# Patient Record
Sex: Female | Born: 1955 | Race: Black or African American | Hispanic: No | Smoking: Never smoker
Health system: Southern US, Community
[De-identification: ages and names within clinical notes are randomized; demographics above are authoritative.]

## PROBLEM LIST (undated history)

## (undated) DIAGNOSIS — I10 Essential (primary) hypertension: Secondary | ICD-10-CM

## (undated) DIAGNOSIS — G479 Sleep disorder, unspecified: Secondary | ICD-10-CM

## (undated) DIAGNOSIS — R0683 Snoring: Secondary | ICD-10-CM

## (undated) DIAGNOSIS — E2839 Other primary ovarian failure: Secondary | ICD-10-CM

## (undated) DIAGNOSIS — K5900 Constipation, unspecified: Secondary | ICD-10-CM

## (undated) DIAGNOSIS — K5792 Diverticulitis of intestine, part unspecified, without perforation or abscess without bleeding: Secondary | ICD-10-CM

## (undated) DIAGNOSIS — R350 Frequency of micturition: Secondary | ICD-10-CM

## (undated) DIAGNOSIS — G4733 Obstructive sleep apnea (adult) (pediatric): Secondary | ICD-10-CM

## (undated) DIAGNOSIS — E669 Obesity, unspecified: Secondary | ICD-10-CM

## (undated) DIAGNOSIS — D649 Anemia, unspecified: Secondary | ICD-10-CM

## (undated) DIAGNOSIS — M25571 Pain in right ankle and joints of right foot: Secondary | ICD-10-CM

## (undated) DIAGNOSIS — Z973 Presence of spectacles and contact lenses: Secondary | ICD-10-CM

## (undated) DIAGNOSIS — R079 Chest pain, unspecified: Secondary | ICD-10-CM

## (undated) HISTORY — DX: Essential (primary) hypertension: I10

## (undated) HISTORY — DX: Constipation, unspecified: K59.00

## (undated) HISTORY — DX: Diverticulitis of intestine, part unspecified, without perforation or abscess without bleeding: K57.92

## (undated) HISTORY — DX: Chest pain, unspecified: R07.9

## (undated) HISTORY — DX: Other primary ovarian failure: E28.39

## (undated) HISTORY — DX: Sleep disorder, unspecified: G47.9

## (undated) HISTORY — DX: Obstructive sleep apnea (adult) (pediatric): G47.33

## (undated) HISTORY — PX: BREAST SURGERY: SHX581

## (undated) HISTORY — DX: Anemia, unspecified: D64.9

## (undated) HISTORY — DX: Hypercalcemia: E83.52

## (undated) HISTORY — PX: COLONOSCOPY: SHX174

## (undated) HISTORY — DX: Pain in right ankle and joints of right foot: M25.571

## (undated) HISTORY — DX: Frequency of micturition: R35.0

---

## 1998-03-23 ENCOUNTER — Ambulatory Visit (HOSPITAL_COMMUNITY): Admission: RE | Admit: 1998-03-23 | Discharge: 1998-03-23 | Payer: Self-pay | Admitting: Family Medicine

## 2000-02-21 ENCOUNTER — Encounter: Payer: Self-pay | Admitting: Family Medicine

## 2000-02-21 ENCOUNTER — Encounter: Admission: RE | Admit: 2000-02-21 | Discharge: 2000-02-21 | Payer: Self-pay | Admitting: Family Medicine

## 2001-04-20 ENCOUNTER — Encounter: Payer: Self-pay | Admitting: Family Medicine

## 2001-04-20 ENCOUNTER — Encounter: Admission: RE | Admit: 2001-04-20 | Discharge: 2001-04-20 | Payer: Self-pay | Admitting: Family Medicine

## 2001-05-24 ENCOUNTER — Encounter: Admission: RE | Admit: 2001-05-24 | Discharge: 2001-05-24 | Payer: Self-pay | Admitting: Family Medicine

## 2001-05-24 ENCOUNTER — Encounter: Payer: Self-pay | Admitting: Family Medicine

## 2002-01-21 ENCOUNTER — Inpatient Hospital Stay (HOSPITAL_COMMUNITY): Admission: RE | Admit: 2002-01-21 | Discharge: 2002-01-24 | Payer: Self-pay | Admitting: Obstetrics and Gynecology

## 2002-01-21 ENCOUNTER — Encounter (INDEPENDENT_AMBULATORY_CARE_PROVIDER_SITE_OTHER): Payer: Self-pay | Admitting: Specialist

## 2002-01-21 HISTORY — PX: TOTAL ABDOMINAL HYSTERECTOMY: SHX209

## 2002-08-20 ENCOUNTER — Encounter: Payer: Self-pay | Admitting: Family Medicine

## 2002-08-20 ENCOUNTER — Encounter: Admission: RE | Admit: 2002-08-20 | Discharge: 2002-08-20 | Payer: Self-pay | Admitting: Family Medicine

## 2004-11-09 ENCOUNTER — Emergency Department (HOSPITAL_COMMUNITY): Admission: EM | Admit: 2004-11-09 | Discharge: 2004-11-09 | Payer: Self-pay

## 2008-04-09 ENCOUNTER — Encounter: Admission: RE | Admit: 2008-04-09 | Discharge: 2008-04-09 | Payer: Self-pay | Admitting: Family Medicine

## 2009-05-15 ENCOUNTER — Encounter: Admission: RE | Admit: 2009-05-15 | Discharge: 2009-05-15 | Payer: Self-pay | Admitting: Family Medicine

## 2009-05-19 ENCOUNTER — Encounter: Admission: RE | Admit: 2009-05-19 | Discharge: 2009-05-19 | Payer: Self-pay | Admitting: Family Medicine

## 2010-11-01 ENCOUNTER — Encounter: Payer: Self-pay | Admitting: Family Medicine

## 2011-02-25 NOTE — Discharge Summary (Signed)
[facility]  Patient:    Veronica Schmidt, Veronica Schmidt Visit Number: [id number] MRN: [id number]          Service Type: GYN Location: 9300 [id number] Attending Physician:  Wandalee Ferdinand Dictated by:   Rudy Jew Ashley Royalty, M.D. Admit Date:  01/21/2002 Discharge Date: 01/24/2002                             Discharge Summary  DISCHARGE DIAGNOSES:          1. Fibroid uterus.                               2. Abnormal uterine bleeding refractory to                                  medical management.  PROCEDURES:                   Total abdominal hysterectomy.  CONSULTATIONS:                None.  DISCHARGE MEDICATIONS:        Percocet and iron.  HISTORY OF PRESENT ILLNESS:   This is a 55 year old gravida 2, para 2, referred through the courtesy of Lupe Carney, M.D. for evaluation of fibroid uterus with abnormal uterine bleeding.  The patient was also noted to have a chronic iron-deficiency anemia.  She had been treated with Megace and iron therapy in order improve her anemia and optimize her candidacy for surgery. For the remainder of the History & Physical, please see chart.  HOSPITAL COURSE:              The patient was admitted to [facility] [place].  Initial laboratory studies were drawn.  On January 21, 2002, she was taken to the operating room and underwent total abdominal hysterectomy. The procedure was uncomplicated.  The patient was noted to have a hemoglobin of 7.9 on April 17 which was consistent with blood loss in addition to her known chronic iron-deficiency anemia.  She was felt to be stable for discharge on January 24, 2002, and was discharged home on the aforementioned medications. Pathology report returned showing fibroid uterus only.  DIAGNOSTIC DATA:              Hemoglobin and hematocrit on admission were 10.1 and 31.3, respectively.  Repeat values obtained on January 24, 2002, were 9.0 and 27.7, respectively.  White blood cell  count at discharge was 6200.   Type and Rh revealed O positive blood.  DISPOSITION:                  The patient will return to [facility] in four to six weeks for postoperative evaluation. Dictated by:   Rudy Jew Ashley Royalty, M.D. Attending Physician:  Wandalee Ferdinand DD:  02/19/02 TD:  02/20/02 Job: [id number]

## 2011-02-25 NOTE — Op Note (Signed)
[facility]  Patient:    Veronica Schmidt, Veronica Schmidt Visit Number: [id number] MRN: [id number]          Service Type: GYN Location: 9300 [id number] Attending Physician:  Wandalee Ferdinand Dictated by:   Rudy Jew Ashley Royalty, M.D. Proc. Date: 01/21/02 Admit Date:  01/21/2002                             Operative Report  PREOPERATIVE DIAGNOSES:       1. Fibroid uterus.                               2. Abnormal uterine bleeding -- probably                                  secondary to #1; refractory to medical                                  management.  POSTOPERATIVE DIAGNOSES:      1. Fibroid uterus.                               2. Abnormal uterine bleeding -- probably                                  secondary to #1; refractory to medical                                  management.  Pathology is pending.  PROCEDURE:                    Total abdominal hysterectomy.  SURGEON:                      Rudy Jew. Ashley Royalty, M.D.  ASSISTANT:                    Everlena Cooper, M.D.  ANESTHESIA:                   General.  ESTIMATED BLOOD LOSS:         100 cc.  COMPLICATIONS:                None.  PACKS/DRAINS:                 Foley catheter.  COUNTS:                       Sponge, needle and instrument count were reported as correct x2.  DESCRIPTION OF PROCEDURE:     The patient was taken to the operating room and placed in the dorsal supine position.  After patient was administered adequate general anesthesia, she was placed in a frog-leg position and prepped and draped in the usual sterile fashion for abdominal and vaginal surgery.  A Pfannenstiel incision was made down to the level of the fascia.  The fascia was nicked with a knife and incised transversely with Mayo scissors.  The underlying rectus muscles were separated from the overlying  fascia using sharp and blunt dissection.  The rectus muscles were separated in the midline, exposing the peritoneum and  entering it atraumatically with Metzenbaum scissors.  The incision was extended longitudinally.  The uterus was identified and noted to have approximately 16-week size, multinodular.  The upper abdomen was explored.  The kidneys were present bilaterally.  The liver surface was smooth.  The peritoneal surfaces were smooth and glistening.  I can appreciate no periaortic adenopathy.  Next, Balfour retractor was placed.  The upper abdomen was packed off.  The fallopian tubes and ovaries were noted bilaterally and felt to be normal.  The round ligaments were then clamped, cut and secured with #1 chromic catgut.  A bladder flap was developed anteriorly using sharp and blunt dissection.  Next, the fallopian tubes, utero-ovarian ligaments were clamped, cut and doubly secured with #1 chromic catgut -- preserving the adnexa bilaterally.   Next, the uterine vessels were skeletonized.  The uterine vessels were then clamped, cut and doubly secured with #1 chromic catgut.  At this point the corpus was excised with the knife and submitted separately to pathology for histologic studies.  Next, straight Heaney valentine clamps were used to come down on either side of the cervix in order clamp, cut and secure the cardinal ligaments. The uterosacral ligaments were clamped, cut and secured with #1 chromic catgut; held separately to be tied later.  Next, the vagina was entered anteriorly.  The cervix was excised with Satinsky scissors, and submitted to pathology for histologic studies.  Richardson angled sutures were placed with 0 chromic catgut bilaterally at the uterine and vaginal angles.  Next, the vaginal cuff was closed circumferentially with a running, locking suture of 2-0 chromic.  As the diameter of the vagina was moderately large, at this point a separate figure-of-eight suture was placed in the midline to avoid any hernia formation.  Hemostasis was noted.  All pedicles were inspected and  found to be dry.  Copious irrigation was accomplished.  The ureters were identified bilaterally and felt to be peristalsing normally.  At this point the urine was clear and copious.  The patient was felt to have benefited maximally from the surgical procedure at this point.  All packs were removed.  The patient was once again noted to be hemostatic. The fascia was then closed with 0 Vicryl in a running fashion.  The skin was closed with staples.  The patient tolerated the procedure extremely well and was returned to the recovery room in good condition. Dictated by:   Rudy Jew Ashley Royalty, M.D. Attending Physician:  Wandalee Ferdinand DD:  01/21/02 TD:  01/21/02 Job: [id number]

## 2011-02-25 NOTE — H&P (Signed)
[facility]  Patient:    Veronica Schmidt, Veronica Schmidt Visit Number: [id number] MRN: [id number]          Service Type: GYN Location: 9300 [id number] 01 Attending Physician:  Wandalee Ferdinand Dictated by:   Rudy Jew Ashley Royalty, M.D. Admit Date:  01/21/2002                           History and Physical  HISTORY OF PRESENT ILLNESS:   A 55 year old gravida 2, para 2, referred through the courtesy of Lupe Carney, M.D. for evaluation of a fibroid uterus with abnormal uterine bleeding.  The patient also has a known chronic anemia. At the time of patients presentation in July 2002, her periods were irregular, lasting seven days, and quite excessive in amount.  An ultrasound was performed at [facility] on April 20, 2001, and revealed a large fibroid uterus.  At that time, the hemoglobin was approximately 7.0. The patient had a subsequent endometrial biopsy which was benign.  She was treated with Megace and iron therapy.  Ultimately, she returned in January stating she continued to have abnormal uterine bleeding, and seeks definitive therapy in the form of hysterectomy.  MEDICATIONS:                  Atenolol, hydrochlorothiazide, iron.  PAST MEDICAL HISTORY:         Hypertension.  PAST SURGICAL HISTORY:        Negative.  ALLERGIES:                    No known drug allergies.  FAMILY HISTORY:               Noncontributory.  SOCIAL HISTORY:               The patient denies use of tobacco or significant alcohol.  REVIEW OF SYSTEMS:            Noncontributory.  PHYSICAL EXAMINATION:  GENERAL:                      A well-developed and well-nourished pleasant white female in no acute distress.  VITAL SIGNS:                  Afebrile and vital signs stable.  SKIN:                         Warm and dry without lesions.  LYMPH:                        There is no supraclavicular, cervical, or inguinal adenopathy.  HEENT:                         Normocephalic.  NECK:                         Supple without thyromegaly.  CHEST:                        Lungs are clear.  CARDIAC:                      Regular rate and rhythm without murmurs, gallops, or rubs.  BREASTS:  Exam deferred.  ABDOMEN:                      Soft and nontender without masses or organomegaly.  Bowel sounds are active.  MUSCULOSKELETAL:              Examination revealed a full range of motion without edema, cyanosis, or significant tenderness.  PELVIC:                       Examination is deferred until examination under anesthesia.  IMPRESSION:                   1. Fibroid uterus - large.                               2. Abnormal uterine bleeding - refractory to                                  medical management.  PLAN:                         Total abdominal hysterectomy.  Risks, benefits, complications, and alternatives were fully discussed with the patient.  She states she understands and accepts.  Questions invited and answered. Dictated by:   Rudy Jew Ashley Royalty, M.D. Attending Physician:  Wandalee Ferdinand DD:  01/21/02 TD:  01/21/02 Job: [id number]

## 2013-04-24 ENCOUNTER — Ambulatory Visit (INDEPENDENT_AMBULATORY_CARE_PROVIDER_SITE_OTHER): Payer: BC Managed Care – PPO | Admitting: General Surgery

## 2013-04-24 ENCOUNTER — Encounter (INDEPENDENT_AMBULATORY_CARE_PROVIDER_SITE_OTHER): Payer: Self-pay | Admitting: General Surgery

## 2013-04-24 VITALS — BP 158/92 | HR 76 | Resp 14 | Ht 64.0 in | Wt 244.2 lb

## 2013-04-24 DIAGNOSIS — L723 Sebaceous cyst: Secondary | ICD-10-CM

## 2013-04-24 DIAGNOSIS — L720 Epidermal cyst: Secondary | ICD-10-CM | POA: Insufficient documentation

## 2013-04-24 NOTE — Progress Notes (Signed)
Patient ID: Veronica Schmidt, female   DOB: 11/09/1955, 57 y.o.   MRN: [id number]  Chief Complaint  Patient presents with  . New Evaluation    sev cyst    HPI Veronica Schmidt is a 57 y.o. female.   HPI 57-year-old African American female referred by Dr. Mitchell for evaluation of the upper back sebaceous cyst. The patient states that it's been there probably for a few years. She states that it has slowly gotten larger over the past year or 2. It recently started draining some fluid a few weeks ago. She describes the fluid as foul smelling. She denies any fevers or chills. She denies any tenderness over the area. She denies any trauma to the area. She denies any weight change. She denies any night sweats. She denies any other similar lesions. She teaches high school biology  Past Medical History  Diagnosis Date  . Anemia   . Hypertension     Past Surgical History  Procedure Laterality Date  . Abdominal hysterectomy      History reviewed. No pertinent family history.  Social History History  Substance Use Topics  . Smoking status: Never Smoker   . Smokeless tobacco: Never Used  . Alcohol Use: No    No Known Allergies  Current Outpatient Prescriptions  Medication Sig Dispense Refill  . atenolol (TENORMIN) 25 MG tablet Take 25 mg by mouth daily.      . losartan-hydrochlorothiazide (HYZAAR) 100-25 MG per tablet       . Multiple Vitamin (MULTI-VITAMIN DAILY PO) Take by mouth.      . Noni, Morinda citrifolia, 250 MG CAPS Take by mouth.       No current facility-administered medications for this visit.    Review of Systems Review of Systems  Constitutional: Negative for fever, chills and unexpected weight change.  HENT: Negative for hearing loss, congestion, sore throat, trouble swallowing and voice change.   Eyes: Negative for visual disturbance.  Respiratory: Negative for cough, shortness of breath and wheezing.   Cardiovascular: Negative for chest pain, palpitations and  leg swelling.  Gastrointestinal: Negative for nausea, vomiting, abdominal pain, diarrhea, constipation and blood in stool.  Genitourinary: Negative for hematuria, vaginal bleeding and difficulty urinating.  Musculoskeletal: Negative for arthralgias.  Skin: Negative for rash and wound.  Neurological: Negative for seizures, syncope and headaches.  Hematological: Negative for adenopathy. Does not bruise/bleed easily.  Psychiatric/Behavioral: Negative for confusion.    Blood pressure 158/92, pulse 76, resp. rate 14, height 5' 4" (1.626 m), weight 244 lb 3.2 oz (110.768 kg).  Physical Exam Physical Exam  Vitals reviewed. Constitutional: She is oriented to person, place, and time. She appears well-developed and well-nourished. No distress.  obese  HENT:  Head: Normocephalic and atraumatic.  Right Ear: External ear normal.  Left Ear: External ear normal.  Eyes: Conjunctivae are normal. No scleral icterus.  Neck: Normal range of motion. Neck supple. No tracheal deviation present. No thyromegaly present.  Cardiovascular: Normal rate and normal heart sounds.   Pulmonary/Chest: Effort normal and breath sounds normal. No stridor. No respiratory distress. She has no wheezes.    Well circumscribed, mobile, soft, soft tissue mass left upper back, 4 x 4cm with central pore with sebum. No erythema, induration, fluctuance. Non tender  Abdominal: She exhibits no distension.  Musculoskeletal: She exhibits no edema and no tenderness.  Lymphadenopathy:    She has no cervical adenopathy.  Neurological: She is alert and oriented to person, place, and time. She exhibits normal   muscle tone.  Skin: Skin is warm and dry. No rash noted. She is not diaphoretic. No erythema.  Psychiatric: She has a normal mood and affect. Her behavior is normal. Judgment and thought content normal.    Data Reviewed Dr Mitchell's note from 06/2012 Labs from 3/14 - nml cbc, nml cmet  Assessment    Upper back epidermal  inclusion cyst (sebaceous cyst)     Plan    We discussed the etiology and management of sebaceous cysts. The patient was given educational material.   We discussed observation versus surgical excision. We discussed the risks and benefits of surgery including but not limited to bleeding, infection, injury to surrounding structures, scarring, cosmetic concerns, blood clot formation, anesthesia issues, possible recurrence, and the typical postoperative course.   The patient has elected to proceed with EXCISION OF UPPER BACK SEBACEOUS CYST  Harrington Jobe M. Dardan Shelton, MD, FACS General, Bariatric, & Minimally Invasive Surgery [facility]        Annabeth Tortora M 04/24/2013, 11:12 AM    

## 2013-04-24 NOTE — Patient Instructions (Signed)

## 2013-04-25 ENCOUNTER — Encounter (INDEPENDENT_AMBULATORY_CARE_PROVIDER_SITE_OTHER): Payer: Self-pay

## 2013-05-02 ENCOUNTER — Encounter (HOSPITAL_BASED_OUTPATIENT_CLINIC_OR_DEPARTMENT_OTHER): Payer: Self-pay | Admitting: *Deleted

## 2013-05-08 ENCOUNTER — Encounter (HOSPITAL_BASED_OUTPATIENT_CLINIC_OR_DEPARTMENT_OTHER)
Admission: RE | Admit: 2013-05-08 | Discharge: 2013-05-08 | Disposition: A | Discharge status: Home / Self Care | Payer: BC Managed Care – PPO | Source: Ambulatory Visit | Attending: General Surgery | Admitting: General Surgery

## 2013-05-08 ENCOUNTER — Encounter (HOSPITAL_BASED_OUTPATIENT_CLINIC_OR_DEPARTMENT_OTHER): Payer: Self-pay | Admitting: *Deleted

## 2013-05-08 LAB — BASIC METABOLIC PANEL
Calcium: 10.4 mg/dL (ref 8.4–10.5)
GFR calc non Af Amer: 67 mL/min — ABNORMAL LOW (ref >90)
Glucose, Bld: 84 mg/dL (ref 70–99)
Potassium: 4.4 mEq/L (ref 3.5–5.1)
Sodium: 134 mEq/L — ABNORMAL LOW (ref 135–145)

## 2013-05-08 NOTE — Progress Notes (Signed)
To come in for [id number]  

## 2013-05-08 NOTE — Progress Notes (Signed)
05/08/13 1021  OBSTRUCTIVE SLEEP APNEA  Have you ever been diagnosed with sleep apnea through a sleep study? No  Do you snore loudly (loud enough to be heard through closed doors)?  1  Do you often feel tired, fatigued, or sleepy during the daytime? 0  Has anyone observed you stop breathing during your sleep? 0  Do you have, or are you being treated for high blood pressure? 1  BMI more than 35 kg/m2? 1  Age over 56 years old? 1  Gender: 0  Obstructive Sleep Apnea Score 4  Score 4 or greater  Results sent to PCP

## 2013-05-09 ENCOUNTER — Encounter (HOSPITAL_BASED_OUTPATIENT_CLINIC_OR_DEPARTMENT_OTHER): Payer: Self-pay | Admitting: Anesthesiology

## 2013-05-09 ENCOUNTER — Ambulatory Visit (HOSPITAL_BASED_OUTPATIENT_CLINIC_OR_DEPARTMENT_OTHER): Payer: BC Managed Care – PPO | Admitting: Anesthesiology

## 2013-05-09 ENCOUNTER — Encounter (HOSPITAL_BASED_OUTPATIENT_CLINIC_OR_DEPARTMENT_OTHER): Payer: Self-pay | Admitting: *Deleted

## 2013-05-09 ENCOUNTER — Encounter (HOSPITAL_BASED_OUTPATIENT_CLINIC_OR_DEPARTMENT_OTHER)
Admission: RE | Disposition: A | Discharge status: Home / Self Care | Payer: Self-pay | Source: Ambulatory Visit | Attending: General Surgery

## 2013-05-09 ENCOUNTER — Ambulatory Visit (HOSPITAL_BASED_OUTPATIENT_CLINIC_OR_DEPARTMENT_OTHER)
Admission: RE | Admit: 2013-05-09 | Discharge: 2013-05-09 | Disposition: A | Discharge status: Home / Self Care | Payer: BC Managed Care – PPO | Source: Ambulatory Visit | Attending: General Surgery | Admitting: General Surgery

## 2013-05-09 DIAGNOSIS — L723 Sebaceous cyst: Secondary | ICD-10-CM

## 2013-05-09 DIAGNOSIS — Z6841 Body Mass Index (BMI) 40.0 and over, adult: Secondary | ICD-10-CM | POA: Insufficient documentation

## 2013-05-09 DIAGNOSIS — Z79899 Other long term (current) drug therapy: Secondary | ICD-10-CM | POA: Insufficient documentation

## 2013-05-09 DIAGNOSIS — D649 Anemia, unspecified: Secondary | ICD-10-CM | POA: Insufficient documentation

## 2013-05-09 DIAGNOSIS — I10 Essential (primary) hypertension: Secondary | ICD-10-CM | POA: Insufficient documentation

## 2013-05-09 HISTORY — PX: CYST REMOVAL TRUNK: SHX6283

## 2013-05-09 HISTORY — DX: Presence of spectacles and contact lenses: Z97.3

## 2013-05-09 HISTORY — DX: Snoring: R06.83

## 2013-05-09 LAB — POCT HEMOGLOBIN-HEMACUE: Hemoglobin: 13.2 g/dL (ref 12.0–15.0)

## 2013-05-09 SURGERY — CYST REMOVAL TRUNK
Anesthesia: Monitor Anesthesia Care | Site: Back | Laterality: Left | Wound class: Contaminated

## 2013-05-09 MED ORDER — OXYCODONE HCL 5 MG PO TABS: 5.0000 mg | ORAL_TABLET | Freq: Once | ORAL | Status: DC | PRN

## 2013-05-09 MED ORDER — FENTANYL CITRATE 0.05 MG/ML IJ SOLN: 25.0000 ug | INTRAMUSCULAR | Status: DC | PRN

## 2013-05-09 MED ORDER — BUPIVACAINE-EPINEPHRINE 0.25% -1:200000 IJ SOLN
INTRAMUSCULAR | Status: DC | PRN
  Administered 2013-05-09: 25 mL

## 2013-05-09 MED ORDER — OXYCODONE HCL 5 MG/5ML PO SOLN: 5.0000 mg | Freq: Once | ORAL | Status: DC | PRN

## 2013-05-09 MED ORDER — METOCLOPRAMIDE HCL 5 MG/ML IJ SOLN: 10.0000 mg | Freq: Once | INTRAMUSCULAR | Status: DC | PRN

## 2013-05-09 MED ORDER — FENTANYL CITRATE 0.05 MG/ML IJ SOLN: 50.0000 ug | INTRAMUSCULAR | Status: DC | PRN

## 2013-05-09 MED ORDER — PROPOFOL 10 MG/ML IV EMUL
INTRAVENOUS | Status: DC | PRN
  Administered 2013-05-09: 50 ug/kg/min via INTRAVENOUS

## 2013-05-09 MED ORDER — ONDANSETRON HCL 4 MG/2ML IJ SOLN
INTRAMUSCULAR | Status: DC | PRN
  Administered 2013-05-09: 4 mg via INTRAVENOUS

## 2013-05-09 MED ORDER — CEFAZOLIN SODIUM-DEXTROSE 2-3 GM-% IV SOLR
2.0000 g | INTRAVENOUS | Status: AC
  Administered 2013-05-09: 2 g via INTRAVENOUS

## 2013-05-09 MED ORDER — METOPROLOL TARTRATE 25 MG PO TABS
25.0000 mg | ORAL_TABLET | Freq: Once | ORAL | Status: AC
  Administered 2013-05-09: 25 mg via ORAL

## 2013-05-09 MED ORDER — FENTANYL CITRATE 0.05 MG/ML IJ SOLN
INTRAMUSCULAR | Status: DC | PRN
  Administered 2013-05-09: 25 ug via INTRAVENOUS
  Administered 2013-05-09: 75 ug via INTRAVENOUS

## 2013-05-09 MED ORDER — MIDAZOLAM HCL 5 MG/5ML IJ SOLN
INTRAMUSCULAR | Status: DC | PRN
  Administered 2013-05-09: 2 mg via INTRAVENOUS

## 2013-05-09 MED ORDER — MIDAZOLAM HCL 2 MG/2ML IJ SOLN: 1.0000 mg | INTRAMUSCULAR | Status: DC | PRN

## 2013-05-09 MED ORDER — DEXAMETHASONE SODIUM PHOSPHATE 4 MG/ML IJ SOLN
INTRAMUSCULAR | Status: DC | PRN
  Administered 2013-05-09: 8 mg via INTRAVENOUS

## 2013-05-09 MED ORDER — SODIUM BICARBONATE 4 % IV SOLN
INTRAVENOUS | Status: DC | PRN
  Administered 2013-05-09: 5 mL via INTRAVENOUS

## 2013-05-09 MED ORDER — OXYCODONE-ACETAMINOPHEN 5-325 MG PO TABS: 1.0000 | ORAL_TABLET | ORAL | Status: DC | PRN

## 2013-05-09 MED ORDER — LACTATED RINGERS IV SOLN
INTRAVENOUS | Status: DC
  Administered 2013-05-09: 11:00:00 via INTRAVENOUS

## 2013-05-09 SURGICAL SUPPLY — 53 items
BENZOIN TINCTURE PRP APPL 2/3 (GAUZE/BANDAGES/DRESSINGS) ×2 IMPLANT
BLADE HEX COATED 2.75 (ELECTRODE) IMPLANT
BLADE SURG 15 STRL LF DISP TIS (BLADE) ×1 IMPLANT
BLADE SURG 15 STRL SS (BLADE) ×1
BLADE SURG ROTATE 9660 (MISCELLANEOUS) IMPLANT
CANISTER SUCTION 1200CC (MISCELLANEOUS) IMPLANT
CHLORAPREP W/TINT 26ML (MISCELLANEOUS) ×2 IMPLANT
COVER MAYO STAND STRL (DRAPES) ×2 IMPLANT
COVER TABLE BACK 60X90 (DRAPES) ×2 IMPLANT
DECANTER SPIKE VIAL GLASS SM (MISCELLANEOUS) IMPLANT
DERMABOND ADVANCED (GAUZE/BANDAGES/DRESSINGS)
DERMABOND ADVANCED .7 DNX12 (GAUZE/BANDAGES/DRESSINGS) IMPLANT
DRAPE PED LAPAROTOMY (DRAPES) ×2 IMPLANT
DRAPE UTILITY XL STRL (DRAPES) ×2 IMPLANT
DRSG TEGADERM 4X4.75 (GAUZE/BANDAGES/DRESSINGS) ×2 IMPLANT
ELECT COATED BLADE 2.86 ST (ELECTRODE) ×2 IMPLANT
ELECT REM PT RETURN 9FT ADLT (ELECTROSURGICAL) ×2
ELECTRODE REM PT RTRN 9FT ADLT (ELECTROSURGICAL) ×1 IMPLANT
GAUZE PACKING IODOFORM 1/4X5 (PACKING) IMPLANT
GAUZE SPONGE 4X4 12PLY STRL LF (GAUZE/BANDAGES/DRESSINGS) IMPLANT
GLOVE BIO SURGEON STRL SZ7.5 (GLOVE) ×2 IMPLANT
GLOVE BIOGEL M 7.0 STRL (GLOVE) ×2 IMPLANT
GLOVE BIOGEL PI IND STRL 7.5 (GLOVE) ×1 IMPLANT
GLOVE BIOGEL PI IND STRL 8 (GLOVE) ×1 IMPLANT
GLOVE BIOGEL PI INDICATOR 7.5 (GLOVE) ×1
GLOVE BIOGEL PI INDICATOR 8 (GLOVE) ×1
GOWN PREVENTION PLUS XLARGE (GOWN DISPOSABLE) ×2 IMPLANT
GOWN PREVENTION PLUS XXLARGE (GOWN DISPOSABLE) ×2 IMPLANT
NEEDLE HYPO 25X1 1.5 SAFETY (NEEDLE) ×2 IMPLANT
NS IRRIG 1000ML POUR BTL (IV SOLUTION) ×2 IMPLANT
PACK BASIN DAY SURGERY FS (CUSTOM PROCEDURE TRAY) ×2 IMPLANT
PENCIL BUTTON HOLSTER BLD 10FT (ELECTRODE) ×2 IMPLANT
SLEEVE SCD COMPRESS KNEE MED (MISCELLANEOUS) IMPLANT
SLEEVE SURGEON STRL (DRAPES) ×2 IMPLANT
SPONGE GAUZE 2X2 8PLY STRL LF (GAUZE/BANDAGES/DRESSINGS) ×2 IMPLANT
SPONGE GAUZE 4X4 12PLY (GAUZE/BANDAGES/DRESSINGS) IMPLANT
STRIP CLOSURE SKIN 1/2X4 (GAUZE/BANDAGES/DRESSINGS) ×2 IMPLANT
SUT ETHILON 2 0 FS 18 (SUTURE) IMPLANT
SUT ETHILON 4 0 PS 2 18 (SUTURE) IMPLANT
SUT MNCRL AB 4-0 PS2 18 (SUTURE) ×2 IMPLANT
SUT SILK 2 0 SH (SUTURE) IMPLANT
SUT VIC AB 2-0 SH 27 (SUTURE)
SUT VIC AB 2-0 SH 27XBRD (SUTURE) IMPLANT
SUT VICRYL 3-0 CR8 SH (SUTURE) ×2 IMPLANT
SUT VICRYL 4-0 PS2 18IN ABS (SUTURE) IMPLANT
SWAB COLLECTION DEVICE MRSA (MISCELLANEOUS) IMPLANT
SYR CONTROL 10ML LL (SYRINGE) ×2 IMPLANT
TOWEL OR 17X24 6PK STRL BLUE (TOWEL DISPOSABLE) ×2 IMPLANT
TOWEL OR NON WOVEN STRL DISP B (DISPOSABLE) ×2 IMPLANT
TUBE ANAEROBIC SPECIMEN COL (MISCELLANEOUS) IMPLANT
TUBE CONNECTING 20X1/4 (TUBING) IMPLANT
UNDERPAD 30X30 INCONTINENT (UNDERPADS AND DIAPERS) IMPLANT
YANKAUER SUCT BULB TIP NO VENT (SUCTIONS) IMPLANT

## 2013-05-09 NOTE — Transfer of Care (Signed)
Immediate Anesthesia Transfer of Care Note  Patient: Veronica Schmidt  Procedure(s) Performed: Procedure(s): EXCISION OF UPPER BACK SEBACEOUS CYST REMOVAL  (Left)  Patient Location: PACU  Anesthesia Type:MAC  Level of Consciousness: awake  Airway & Oxygen Therapy: Patient Spontanous Breathing and Patient connected to face mask oxygen  Post-op Assessment: Report given to PACU RN and Post -op Vital signs reviewed and stable  Post vital signs: Reviewed and stable  Complications: No apparent anesthesia complications

## 2013-05-09 NOTE — Anesthesia Postprocedure Evaluation (Signed)
Anesthesia Post Note  Patient: Veronica Schmidt  Procedure(s) Performed: Procedure(s) (LRB): EXCISION OF UPPER BACK SEBACEOUS CYST REMOVAL  (Left)  Anesthesia type: MAC  Patient location: PACU  Post pain: Pain level controlled  Post assessment: Patient's Cardiovascular Status Stable  Last Vitals:  Filed Vitals:   05/09/13 1430  BP: 146/89  Pulse: 50  Temp:   Resp: 15    Post vital signs: Reviewed and stable  Level of consciousness: alert  Complications: No apparent anesthesia complications

## 2013-05-09 NOTE — Op Note (Signed)
05/09/2013  2:01 PM  PATIENT:  Veronica Schmidt  57 y.o. female  PRE-OPERATIVE DIAGNOSIS:  Left upper back epidermoid inclusion cyst   POST-OPERATIVE DIAGNOSIS:  same   PROCEDURE:  Procedure(s): EXCISION OF UPPER BACK EPIDERMOID INCLUSION CYST (5CM)   SURGEON:  Surgeon(s): Atilano Ina, MD  ASSISTANTS: none   ANESTHESIA:   local and MAC  DRAINS: none   LOCAL MEDICATIONS USED:  MARCAINE     SPECIMEN:  Source of Specimen:  epidermoid inclusion cyst  DISPOSITION OF SPECIMEN:  PATHOLOGY  COUNTS:  YES  INDICATION FOR PROCEDURE: 57 year old African American female referred by Dr. [name] for evaluation of the upper back sebaceous cyst. The patient states that it's been there probably for a few years. She states that it has slowly gotten larger over the past year or 2. It recently started draining some fluid a few weeks ago. She describes the fluid as foul smelling. We discussed observation versus surgical excision. We discussed the risks and benefits of surgery including but not limited to bleeding, infection, injury to surrounding structures, scarring, cosmetic concerns, blood clot formation, anesthesia issues, possible recurrence, and the typical postoperative course.  The patient has elected to proceed with EXCISION OF UPPER BACK SEBACEOUS CYST  PROCEDURE: After obtaining informed consent, the patient was taken back to operating room 8 at [facility]. She was placed in the prone position with appropriate padding and monitored anesthesia care was established. Her left upper back was prepped and draped in the usual standard surgical fashion with ChloraPrep. She received IV antibiotics prior to skin incision. A surgical timeout was performed. The lesion measured 5 cm x 4 half centimeters slightly medial to her left scapula. Local was infiltrated over the midpoint of the mass. There is a central pore. A vertical skin incision was made making a small ellipse around the central  pore and extending inferiorly. The incision was 5 cm long. The subcutaneous tissue and deep dermis was lifted up off of the epidermoid inclusion cyst wall using a mosquito clamp. Bovie electrocautery was also used to free the cyst from the deep fat tissue. There was some spillage of sebum.  However the cyst was excised in its entirety. There is no wall left behind. Hemostasis was achieved. Additional local was infiltrated. The wound was irrigated with diluted Betadine. The wound was closed in multiple layers with interrupted inverted 3-0 Vicryl sutures. The skin was reapproximated with a running 4-0 Monocryl in a subcuticular fashion followed by the application of benzoin, Steri-Strips, 2 x 2, and a Tegaderm. The patient was placed in the supine position and taken to the recovery room in stable condition. All needle, instrument, and sponge counts were correct x2. There are no immediate complications  PLAN OF CARE: Discharge to home after PACU  PATIENT DISPOSITION:  PACU - hemodynamically stable.   Delay start of Pharmacological VTE agent (>24hrs) due to surgical blood loss or risk of bleeding:  not applicable  Mary Sella. Andrey Campanile, MD, FACS General, Bariatric, & Minimally Invasive Surgery [facility], [place]

## 2013-05-09 NOTE — Anesthesia Preprocedure Evaluation (Addendum)
Anesthesia Evaluation  Patient identified by MRN, date of birth, ID band Patient awake    Reviewed: Allergy & Precautions, H&P , NPO status , Patient's Chart, lab work & pertinent test results, reviewed documented beta blocker date and time   Airway Mallampati: II TM Distance: >3 FB Neck ROM: full    Dental   Pulmonary neg pulmonary ROS,  breath sounds clear to auscultation        Cardiovascular hypertension, On Medications and On Home Beta Blockers Rhythm:regular     Neuro/Psych negative neurological ROS  negative psych ROS   GI/Hepatic negative GI ROS, Neg liver ROS,   Endo/Other  negative endocrine ROSMorbid obesity  Renal/GU negative Renal ROS  negative genitourinary   Musculoskeletal   Abdominal   Peds  Hematology  (+) anemia ,   Anesthesia Other Findings See surgeon's H&P   Reproductive/Obstetrics negative OB ROS                           Anesthesia Physical Anesthesia Plan  ASA: III  Anesthesia Plan: MAC   Post-op Pain Management:    Induction:   Airway Management Planned: Simple Face Mask  Additional Equipment:   Intra-op Plan:   Post-operative Plan:   Informed Consent: I have reviewed the patients History and Physical, chart, labs and discussed the procedure including the risks, benefits and alternatives for the proposed anesthesia with the patient or authorized representative who has indicated his/her understanding and acceptance.   Dental Advisory Given  Plan Discussed with: CRNA and Surgeon  Anesthesia Plan Comments:        Anesthesia Quick Evaluation

## 2013-05-09 NOTE — H&P (View-Only) (Signed)
Patient ID: Veronica Schmidt, female   DOB: 1956/06/08, 57 y.o.   MRN: [id number]  Chief Complaint  Patient presents with  . New Evaluation    sev cyst    HPI Veronica Schmidt is a 57 y.o. female.   HPI 57 year old African American female referred by Dr. [name] for evaluation of the upper back sebaceous cyst. The patient states that it's been there probably for a few years. She states that it has slowly gotten larger over the past year or 2. It recently started draining some fluid a few weeks ago. She describes the fluid as foul smelling. She denies any fevers or chills. She denies any tenderness over the area. She denies any trauma to the area. She denies any weight change. She denies any night sweats. She denies any other similar lesions. She teaches high school biology  Past Medical History  Diagnosis Date  . Anemia   . Hypertension     Past Surgical History  Procedure Laterality Date  . Abdominal hysterectomy      History reviewed. No pertinent family history.  Social History History  Substance Use Topics  . Smoking status: Never Smoker   . Smokeless tobacco: Never Used  . Alcohol Use: No    No Known Allergies  Current Outpatient Prescriptions  Medication Sig Dispense Refill  . atenolol (TENORMIN) 25 MG tablet Take 25 mg by mouth daily.      Marland Kitchen losartan-hydrochlorothiazide (HYZAAR) 100-25 MG per tablet       . Multiple Vitamin (MULTI-VITAMIN DAILY PO) Take by mouth.      . Noni, Morinda citrifolia, 250 MG CAPS Take by mouth.       No current facility-administered medications for this visit.    Review of Systems Review of Systems  Constitutional: Negative for fever, chills and unexpected weight change.  HENT: Negative for hearing loss, congestion, sore throat, trouble swallowing and voice change.   Eyes: Negative for visual disturbance.  Respiratory: Negative for cough, shortness of breath and wheezing.   Cardiovascular: Negative for chest pain, palpitations and  leg swelling.  Gastrointestinal: Negative for nausea, vomiting, abdominal pain, diarrhea, constipation and blood in stool.  Genitourinary: Negative for hematuria, vaginal bleeding and difficulty urinating.  Musculoskeletal: Negative for arthralgias.  Skin: Negative for rash and wound.  Neurological: Negative for seizures, syncope and headaches.  Hematological: Negative for adenopathy. Does not bruise/bleed easily.  Psychiatric/Behavioral: Negative for confusion.    Blood pressure 158/92, pulse 76, resp. rate 14, height 5\' 4"  (1.626 m), weight 244 lb 3.2 oz (110.768 kg).  Physical Exam Physical Exam  Vitals reviewed. Constitutional: She is oriented to person, place, and time. She appears well-developed and well-nourished. No distress.  obese  HENT:  Head: Normocephalic and atraumatic.  Right Ear: External ear normal.  Left Ear: External ear normal.  Eyes: Conjunctivae are normal. No scleral icterus.  Neck: Normal range of motion. Neck supple. No tracheal deviation present. No thyromegaly present.  Cardiovascular: Normal rate and normal heart sounds.   Pulmonary/Chest: Effort normal and breath sounds normal. No stridor. No respiratory distress. She has no wheezes.    Well circumscribed, mobile, soft, soft tissue mass left upper back, 4 x 4cm with central pore with sebum. No erythema, induration, fluctuance. Non tender  Abdominal: She exhibits no distension.  Musculoskeletal: She exhibits no edema and no tenderness.  Lymphadenopathy:    She has no cervical adenopathy.  Neurological: She is alert and oriented to person, place, and time. She exhibits normal  muscle tone.  Skin: Skin is warm and dry. No rash noted. She is not diaphoretic. No erythema.  Psychiatric: She has a normal mood and affect. Her behavior is normal. Judgment and thought content normal.    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 The patient was given Agricultural engineer.   We discussed observation versus surgical excision. We discussed the risks and benefits of surgery including but not limited to bleeding, infection, injury to surrounding structures, scarring, cosmetic concerns, blood clot formation, anesthesia issues, possible recurrence, and the typical postoperative course.   The patient has elected to proceed with EXCISION OF UPPER BACK SEBACEOUS CYST  Mary Sella. Andrey Campanile, MD, FACS General, Bariatric, & Minimally Invasive Surgery [facility], [place]        [facility] M 04/24/2013, 11:12 AM

## 2013-05-09 NOTE — Interval H&P Note (Signed)
History and Physical Interval Note:  05/09/2013 12:59 PM  Veronica Schmidt  has presented today for surgery, with the diagnosis of upper back sebaceous cyst   The various methods of treatment have been discussed with the patient and family. After consideration of risks, benefits and other options for treatment, the patient has consented to  Procedure(s): EXCISION OF UPPER BACK SEBACEOUS CYST REMOVAL  (N/A) as a surgical intervention .  The patient's history has been reviewed, patient examined, no change in status, stable for surgery.  I have reviewed the patient's chart and labs.  Questions were answered to the patient's satisfaction.    Mary Sella. Andrey Campanile, MD, FACS General, Bariatric, & Minimally Invasive Surgery [facility], [place]   [facility] M

## 2013-05-10 ENCOUNTER — Encounter (HOSPITAL_BASED_OUTPATIENT_CLINIC_OR_DEPARTMENT_OTHER): Payer: Self-pay | Admitting: General Surgery

## 2013-05-13 ENCOUNTER — Telehealth (INDEPENDENT_AMBULATORY_CARE_PROVIDER_SITE_OTHER): Payer: Self-pay | Admitting: General Surgery

## 2013-05-13 NOTE — Telephone Encounter (Signed)
pls call pt with path report - sebaceous cyst/epidermoid inclusion cyst [facility] 05/13/13 at 10:42 asking patient to return my call just to give her path results per EW

## 2013-05-17 ENCOUNTER — Telehealth (INDEPENDENT_AMBULATORY_CARE_PROVIDER_SITE_OTHER): Payer: Self-pay

## 2013-05-17 NOTE — Telephone Encounter (Signed)
Pt called stating she noticed clear slightly red drainage on the back of her shirt today. Wound not red, feverish,more swollen or more painful. Steri strips still in place. Pt advised it is not uncommon to have clear serous fluid to leak from wound after surgery. Pt to cover with dry gauze drsg. Pt to watch for increased swelling,pain,change in drainage,opening of wd or fever at wd site. If any of these occur pt is advised she will need to be seen by our office. Pt states she understands.

## 2013-05-27 ENCOUNTER — Telehealth (INDEPENDENT_AMBULATORY_CARE_PROVIDER_SITE_OTHER): Payer: Self-pay

## 2013-05-27 NOTE — Telephone Encounter (Addendum)
Called and left message for patient regarding post op appointment scheduled for Wed 05/29/13 @ 4:15 w/Dr. Andrey Campanile.  Message left to see if patient would like to come in at 2:15 pm w/Dr. Andrey Campanile instead of 4:15 that day.  (CORRECTION appointment time need's to be 3:15 pm)

## 2013-05-28 NOTE — Telephone Encounter (Signed)
LMOM @9 :03 asking patient to either be here at 3:15 for tomorrows appt 8/20 or to R/S for another day..If she calls back we can R/S her for 4:15 on 8/21

## 2013-05-28 NOTE — Telephone Encounter (Signed)
Please see below.

## 2013-05-28 NOTE — Telephone Encounter (Signed)
Patient called back and r/s to Thursday 05/30/13 @ 4:15 pm w/Dr. Andrey Campanile.

## 2013-05-29 ENCOUNTER — Encounter (INDEPENDENT_AMBULATORY_CARE_PROVIDER_SITE_OTHER): Payer: BC Managed Care – PPO | Admitting: General Surgery

## 2013-05-30 ENCOUNTER — Encounter (INDEPENDENT_AMBULATORY_CARE_PROVIDER_SITE_OTHER): Payer: BC Managed Care – PPO | Admitting: General Surgery

## 2013-06-06 ENCOUNTER — Encounter (INDEPENDENT_AMBULATORY_CARE_PROVIDER_SITE_OTHER): Payer: BC Managed Care – PPO | Admitting: General Surgery

## 2013-06-19 ENCOUNTER — Encounter (INDEPENDENT_AMBULATORY_CARE_PROVIDER_SITE_OTHER): Payer: Self-pay | Admitting: General Surgery

## 2013-07-09 ENCOUNTER — Encounter (INDEPENDENT_AMBULATORY_CARE_PROVIDER_SITE_OTHER): Payer: Self-pay

## 2015-10-09 ENCOUNTER — Encounter (HOSPITAL_COMMUNITY): Payer: Self-pay | Admitting: Emergency Medicine

## 2015-10-09 ENCOUNTER — Emergency Department (HOSPITAL_COMMUNITY)
Admission: EM | Admit: 2015-10-09 | Discharge: 2015-10-10 | Disposition: A | Discharge status: Home / Self Care | Payer: BC Managed Care – PPO | Attending: Emergency Medicine | Admitting: Emergency Medicine

## 2015-10-09 DIAGNOSIS — R51 Headache: Secondary | ICD-10-CM | POA: Insufficient documentation

## 2015-10-09 DIAGNOSIS — Z79899 Other long term (current) drug therapy: Secondary | ICD-10-CM | POA: Insufficient documentation

## 2015-10-09 DIAGNOSIS — I10 Essential (primary) hypertension: Secondary | ICD-10-CM | POA: Insufficient documentation

## 2015-10-09 DIAGNOSIS — Z862 Personal history of diseases of the blood and blood-forming organs and certain disorders involving the immune mechanism: Secondary | ICD-10-CM | POA: Insufficient documentation

## 2015-10-09 DIAGNOSIS — R519 Headache, unspecified: Secondary | ICD-10-CM

## 2015-10-09 LAB — CBC WITH DIFFERENTIAL/PLATELET
BASOS PCT: 0 %
Basophils Absolute: 0 10*3/uL (ref 0.0–0.1)
EOS PCT: 5 %
Eosinophils Absolute: 0.4 10*3/uL (ref 0.0–0.7)
HCT: 35.4 % — ABNORMAL LOW (ref 36.0–46.0)
HEMOGLOBIN: 12.2 g/dL (ref 12.0–15.0)
LYMPHS PCT: 38 %
Lymphs Abs: 3 10*3/uL (ref 0.7–4.0)
MCH: 24.9 pg — AB (ref 26.0–34.0)
MCHC: 34.5 g/dL (ref 30.0–36.0)
MCV: 72.4 fL — AB (ref 78.0–100.0)
MONO ABS: 0.4 10*3/uL (ref 0.1–1.0)
MONOS PCT: 5 %
NEUTROS PCT: 52 %
Neutro Abs: 4.1 10*3/uL (ref 1.7–7.7)
PLATELETS: 239 10*3/uL (ref 150–400)
RBC: 4.89 MIL/uL (ref 3.87–5.11)
RDW: 14.5 % (ref 11.5–15.5)
WBC: 7.9 10*3/uL (ref 4.0–10.5)

## 2015-10-09 LAB — COMPREHENSIVE METABOLIC PANEL
ALK PHOS: 89 U/L (ref 38–126)
ALT: 21 U/L (ref 14–54)
AST: 21 U/L (ref 15–41)
Albumin: 3.5 g/dL (ref 3.5–5.0)
Anion gap: 9 (ref 5–15)
BUN: 12 mg/dL (ref 6–20)
CHLORIDE: 105 mmol/L (ref 101–111)
CO2: 26 mmol/L (ref 22–32)
Calcium: 9.7 mg/dL (ref 8.9–10.3)
Creatinine, Ser: 0.95 mg/dL (ref 0.44–1.00)
Glucose, Bld: 91 mg/dL (ref 65–99)
POTASSIUM: 4 mmol/L (ref 3.5–5.1)
Sodium: 140 mmol/L (ref 135–145)
Total Bilirubin: 0.7 mg/dL (ref 0.3–1.2)
Total Protein: 7.5 g/dL (ref 6.5–8.1)

## 2015-10-09 MED ORDER — IBUPROFEN 400 MG PO TABS
ORAL_TABLET | ORAL | Status: AC
  Filled 2015-10-09: qty 1

## 2015-10-09 MED ORDER — IBUPROFEN 400 MG PO TABS
400.0000 mg | ORAL_TABLET | Freq: Once | ORAL | Status: AC
  Administered 2015-10-09: 400 mg via ORAL

## 2015-10-09 NOTE — Discharge Instructions (Signed)
You were evaluated in the ED today for high blood pressure and headache. There does not appear to be an emergent cause for your symptoms at this time. It is important to continue taking your medications and follow-up with your doctor for reevaluation in the next couple of days. Return to ED for any new or worsening symptoms as we discussed.  DASH Eating Plan DASH stands for "Dietary Approaches to Stop Hypertension." The DASH eating plan is a healthy eating plan that has been shown to reduce high blood pressure (hypertension). Additional health benefits may include reducing the risk of type 2 diabetes mellitus, heart disease, and stroke. The DASH eating plan may also help with weight loss. WHAT DO I NEED TO KNOW ABOUT THE DASH EATING PLAN? For the DASH eating plan, you will follow these general guidelines:  Choose foods with a percent daily value for sodium of less than 5% (as listed on the food label).  Use salt-free seasonings or herbs instead of table salt or sea salt.  Check with your health care provider or pharmacist before using salt substitutes.  Eat lower-sodium products, often labeled as "lower sodium" or "no salt added."  Eat fresh foods.  Eat more vegetables, fruits, and low-fat dairy products.  Choose whole grains. Look for the word "whole" as the first word in the ingredient list.  Choose fish and skinless chicken or [place] more often than red meat. Limit fish, poultry, and meat to 6 oz (170 g) each day.  Limit sweets, desserts, sugars, and sugary drinks.  Choose heart-healthy fats.  Limit cheese to 1 oz (28 g) per day.  Eat more home-cooked food and less restaurant, buffet, and fast food.  Limit fried foods.  Cook foods using methods other than frying.  Limit canned vegetables. If you do use them, rinse them well to decrease the sodium.  When eating at a restaurant, ask that your food be prepared with less salt, or no salt if possible. WHAT FOODS CAN I EAT? Seek  help from a dietitian for individual calorie needs. Grains Whole grain or whole wheat bread. Brown rice. Whole grain or whole wheat pasta. Quinoa, bulgur, and whole grain cereals. Low-sodium cereals. Corn or whole wheat flour tortillas. Whole grain cornbread. Whole grain crackers. Low-sodium crackers. Vegetables Fresh or frozen vegetables (raw, steamed, roasted, or grilled). Low-sodium or reduced-sodium tomato and vegetable juices. Low-sodium or reduced-sodium tomato sauce and paste. Low-sodium or reduced-sodium canned vegetables.  Fruits All fresh, canned (in natural juice), or frozen fruits. Meat and Other Protein Products Ground beef (85% or leaner), grass-fed beef, or beef trimmed of fat. Skinless chicken or [place]. Ground chicken or [place]. Pork trimmed of fat. All fish and seafood. Eggs. Dried beans, peas, or lentils. Unsalted nuts and seeds. Unsalted canned beans. Dairy Low-fat dairy products, such as skim or 1% milk, 2% or reduced-fat cheeses, low-fat ricotta or cottage cheese, or plain low-fat yogurt. Low-sodium or reduced-sodium cheeses. Fats and Oils Tub margarines without trans fats. Light or reduced-fat mayonnaise and salad dressings (reduced sodium). Avocado. Safflower, olive, or canola oils. Natural peanut or almond butter. Other Unsalted popcorn and pretzels. The items listed above may not be a complete list of recommended foods or beverages. Contact your dietitian for more options. WHAT FOODS ARE NOT RECOMMENDED? Grains White bread. White pasta. White rice. Refined cornbread. Bagels and croissants. Crackers that contain trans fat. Vegetables Creamed or fried vegetables. Vegetables in a cheese sauce. Regular canned vegetables. Regular canned tomato sauce and paste. Regular tomato and  vegetable juices. Fruits Dried fruits. Canned fruit in light or heavy syrup. Fruit juice. Meat and Other Protein Products Fatty cuts of meat. Ribs, chicken wings, bacon, sausage, bologna, salami,  chitterlings, fatback, hot dogs, bratwurst, and packaged luncheon meats. Salted nuts and seeds. Canned beans with salt. Dairy Whole or 2% milk, cream, half-and-half, and cream cheese. Whole-fat or sweetened yogurt. Full-fat cheeses or blue cheese. Nondairy creamers and whipped toppings. Processed cheese, cheese spreads, or cheese curds. Condiments Onion and garlic salt, seasoned salt, table salt, and sea salt. Canned and packaged gravies. Worcestershire sauce. Tartar sauce. Barbecue sauce. Teriyaki sauce. Soy sauce, including reduced sodium. Steak sauce. Fish sauce. Oyster sauce. Cocktail sauce. Horseradish. Ketchup and mustard. Meat flavorings and tenderizers. Bouillon cubes. Hot sauce. Tabasco sauce. Marinades. Taco seasonings. Relishes. Fats and Oils Butter, stick margarine, lard, shortening, ghee, and bacon fat. Coconut, palm kernel, or palm oils. Regular salad dressings. Other Pickles and olives. Salted popcorn and pretzels. The items listed above may not be a complete list of foods and beverages to avoid. Contact your dietitian for more information. WHERE CAN I FIND MORE INFORMATION? National Heart, Lung, and Blood Institute: CableProm[employer].itwww.nhlbi.nih.gov/health/health-topics/topics/dash/   This information is not intended to replace advice given to you by your health care provider. Make sure you discuss any questions you have with your health care provider.   Document Released: 09/15/2011 Document Revised: 10/17/2014 Document Reviewed: 07/31/2013 Elsevier Interactive Patient Education 2016 Elsevier Inc.  General Headache Without Cause A headache is pain or discomfort felt around the head or neck area. The specific cause of a headache may not be found. There are many causes and types of headaches. A few common ones are:  Tension headaches.  Migraine headaches.  Cluster headaches.  Chronic daily headaches. HOME CARE INSTRUCTIONS  Watch your condition for any changes. Take these steps to help  with your condition: Managing Pain  Take over-the-counter and prescription medicines only as told by your health care provider.  Lie down in a dark, quiet room when you have a headache.  If directed, apply ice to the head and neck area:  Put ice in a plastic bag.  Place a towel between your skin and the bag.  Leave the ice on for 20 minutes, 2-3 times per day.  Use a heating pad or hot shower to apply heat to the head and neck area as told by your health care provider.  Keep lights dim if bright lights bother you or make your headaches worse. Eating and Drinking  Eat meals on a regular schedule.  Limit alcohol use.  Decrease the amount of caffeine you drink, or stop drinking caffeine. General Instructions  Keep all follow-up visits as told by your health care provider. This is important.  Keep a headache journal to help find out what may trigger your headaches. For example, write down:  What you eat and drink.  How much sleep you get.  Any change to your diet or medicines.  Try massage or other relaxation techniques.  Limit stress.  Sit up straight, and do not tense your muscles.  Do not use tobacco products, including cigarettes, chewing tobacco, or e-cigarettes. If you need help quitting, ask your health care provider.  Exercise regularly as told by your health care provider.  Sleep on a regular schedule. Get 7-9 hours of sleep, or the amount recommended by your health care provider. SEEK MEDICAL CARE IF:   Your symptoms are not helped by medicine.  You have a headache that is  different from the usual headache.  You have nausea or you vomit.  You have a fever. SEEK IMMEDIATE MEDICAL CARE IF:   Your headache becomes severe.  You have repeated vomiting.  You have a stiff neck.  You have a loss of vision.  You have problems with speech.  You have pain in the eye or ear.  You have muscular weakness or loss of muscle control.  You lose your  balance or have trouble walking.  You feel faint or pass out.  You have confusion.   This information is not intended to replace advice given to you by your health care provider. Make sure you discuss any questions you have with your health care provider.   Document Released: 09/26/2005 Document Revised: 06/17/2015 Document Reviewed: 01/19/2015 Elsevier Interactive Patient Education [employer]

## 2015-10-09 NOTE — ED Notes (Signed)
Pt from for eval of frontal headache x3 days, states also HTN. Pt states hx of HTN but has not been taking her medications, she saw her PCP today and was given new BP meds that she has taken today. Pt with no neuro deficits at this time, denies any blurred vision, denies any n/v/d or fevers.

## 2015-10-09 NOTE — ED Provider Notes (Signed)
CSN: [id number]     Arrival date & time 10/09/15  1554 History   First MD Initiated Contact with Patient 10/09/15 2045     Chief Complaint  Patient presents with  . Headache  . Hypertension     (Consider location/radiation/quality/duration/timing/severity/associated sxs/prior Treatment) HPI Veronica Schmidt is a 59 y.o. female with history of hypertension comes in for evaluation of hypertension and headache. Patient reports she has not been taking hypertension medications for "over a year". Patient reports she was seen at her PCPs office 3 days ago for headache and was recently changed on her blood pressure medications. Reports she has been taking new medications, but reports headache today characterized as throbbing sensation in her right posterior neck and head. She also reports associated "cold water sensation running through my head". She has not tried anything to improve her symptoms. She did receive ibuprofen upon arrival in the ED and reports that improved her headache and she now rates her discomfort as a 3/10. Denies any fevers, chills, vision changes, shortness of breath or chest pain, numbness or weakness, urinary changes, photophobia, phonophobia, nausea or vomiting.  Past Medical History  Diagnosis Date  . Anemia   . Hypertension   . Wears glasses   . Snores    Past Surgical History  Procedure Laterality Date  . Total abdominal hysterectomy  01/21/2002  . Breast surgery      br [id number]  . Colonoscopy    . Cyst removal trunk Left 05/09/2013    Procedure: EXCISION OF UPPER BACK SEBACEOUS CYST REMOVAL ;  Surgeon: Atilano InaEric M Wilson, MD;  Location: [facility];  Service: General;  Laterality: Left;   No family history on file. Social History  Substance Use Topics  . Smoking status: Never Smoker   . Smokeless tobacco: Never Used  . Alcohol Use: No   OB History    No data available     Review of Systems A 10 point review of systems was completed and was negative  except for pertinent positives and negatives as mentioned in the history of present illness     Allergies  Amlodipine  Home Medications   Prior to Admission medications   Medication Sig Start Date End Date Taking? Authorizing Provider  Chlorphen-Phenyleph-APAP (CORICIDIN D COLD/FLU/SINUS) 2-5-325 MG TABS Take 2 tablets by mouth daily as needed (for cold).   Yes Historical Provider, MD  losartan-hydrochlorothiazide (HYZAAR) 100-25 MG per tablet  02/01/13  Yes Historical Provider, MD  metoprolol tartrate (LOPRESSOR) 25 MG tablet Take 25 mg by mouth 2 (two) times daily.   Yes Historical Provider, MD  Multiple Vitamin (MULTIVITAMIN WITH MINERALS) TABS tablet Take 1 tablet by mouth daily.   Yes Historical Provider, MD  oxyCODONE-acetaminophen (ROXICET) 5-325 MG per tablet Take 1-2 tablets by mouth every 4 (four) hours as needed for pain. 05/09/13   Gaynelle AduEric Wilson, MD   BP 126/74 mmHg  Pulse 63  Temp(Src) 98.3 F (36.8 C) (Oral)  Resp 16  Ht 5\' 4"  (1.626 m)  Wt 108.863 kg  BMI 41.18 kg/m2  SpO2 97% Physical Exam  Constitutional: She is oriented to person, place, and time. She appears well-developed and well-nourished. No distress.  HENT:  Head: Normocephalic and atraumatic.  Mouth/Throat: Oropharynx is clear and moist.  Eyes: Conjunctivae and EOM are normal. Pupils are equal, round, and reactive to light.  Neck: Normal range of motion.  No meningismus or nuchal rigidity  Cardiovascular: Normal rate, regular rhythm and normal heart sounds.  Pulmonary/Chest: Effort normal and breath sounds normal.  Abdominal: Soft. There is no tenderness.  Musculoskeletal: Normal range of motion. She exhibits no edema or tenderness.  Neurological: She is alert and oriented to person, place, and time.  Cranial nerves III through XII grossly intact. Motor strength is 5/5 in all 4 extremities and sensation is intact to light touch. Completes finger to nose coordination movements without difficulty. No  nystagmus. Gait is baseline without ataxia.  Skin: She is not diaphoretic.    ED Course  Procedures (including critical care time) Labs Review Labs Reviewed  CBC WITH DIFFERENTIAL/PLATELET - Abnormal; Notable for the following:    HCT 35.4 (*)    MCV 72.4 (*)    MCH 24.9 (*)    All other components within normal limits  COMPREHENSIVE METABOLIC PANEL    Imaging Review No results found. I have personally reviewed and evaluated these images and lab results as part of my medical decision-making.   EKG Interpretation None     During my evaluation, patient's blood pressure 160s over 110s. Upon recheck at 23:25, patient is resting comfortably in the ED and blood pressure is 130/90.  Meds given in ED:  Medications  ibuprofen (ADVIL,MOTRIN) 400 MG tablet (not administered)  ibuprofen (ADVIL,MOTRIN) tablet 400 mg (400 mg Oral Given 10/09/15 1626)    New Prescriptions   No medications on file   Filed Vitals:   10/09/15 2230 10/09/15 2300 10/09/15 2330 10/09/15 2344  BP: 160/90 139/92 126/74   Pulse: 66 62 63   Temp:    98.3 F (36.8 C)  TempSrc:    Oral  Resp:      Height:      Weight:      SpO2: 99% 98% 97%     MDM  Veronica Schmidt is a 59 y.o. female who presents for evaluation of headache and elevated blood pressure. Patient has been noncompliant with hypertension medications over the past year, recently started on blood pressure medications in the past 3 days. She presents today for evaluation of headache. On arrival, her blood pressure is 179/119 and she reports her headache is improving with administration of oral ibuprofen. She has a nonfocal neuro exam and appears very pleasant and in no apparent distress. Benign cardiopulmonary exam. Basic labs are unremarkable, creatinine is 0.95. Patient reports very minimal pain now in ED. And upon recheck at 23:25, patient is resting comfortably in ED, sleeping, and blood pressure is 130/90. No evidence of hypertensive emergency  at this time. Discussed she will need follow-up with her PCP in the next 2 days. We discussed return precautions and she is agreeable with this plan. Patient is appropriate for discharge and hemodynamically stable. Prior to patient discharge, I discussed and reviewed this case with Dr.Wentz   The patient appears reasonably screened and/or stabilized for discharge and I doubt any other medical condition or other [facility] requiring further screening, evaluation, or treatment in the ED at this time prior to discharge.   Final diagnoses:  Nonintractable headache, unspecified chronicity pattern, unspecified headache type  Essential hypertension        Joycie Peek, PA-C 10/10/15 0001  Mancel Bale, MD 10/10/15 [phone]

## 2015-10-12 ENCOUNTER — Encounter (HOSPITAL_COMMUNITY): Payer: Self-pay | Admitting: Emergency Medicine

## 2015-10-12 ENCOUNTER — Emergency Department (HOSPITAL_COMMUNITY)
Admission: EM | Admit: 2015-10-12 | Discharge: 2015-10-12 | Disposition: A | Discharge status: Home / Self Care | Payer: BC Managed Care – PPO | Attending: Emergency Medicine | Admitting: Emergency Medicine

## 2015-10-12 ENCOUNTER — Emergency Department (HOSPITAL_COMMUNITY): Payer: BC Managed Care – PPO

## 2015-10-12 DIAGNOSIS — Z79899 Other long term (current) drug therapy: Secondary | ICD-10-CM | POA: Insufficient documentation

## 2015-10-12 DIAGNOSIS — I10 Essential (primary) hypertension: Secondary | ICD-10-CM | POA: Insufficient documentation

## 2015-10-12 DIAGNOSIS — R51 Headache: Secondary | ICD-10-CM | POA: Insufficient documentation

## 2015-10-12 DIAGNOSIS — E669 Obesity, unspecified: Secondary | ICD-10-CM | POA: Insufficient documentation

## 2015-10-12 DIAGNOSIS — R519 Headache, unspecified: Secondary | ICD-10-CM

## 2015-10-12 DIAGNOSIS — Z862 Personal history of diseases of the blood and blood-forming organs and certain disorders involving the immune mechanism: Secondary | ICD-10-CM | POA: Diagnosis not present

## 2015-10-12 HISTORY — DX: Obesity, unspecified: E66.9

## 2015-10-12 LAB — CBC WITH DIFFERENTIAL/PLATELET
BASOS ABS: 0 10*3/uL (ref 0.0–0.1)
Basophils Relative: 0 %
EOS ABS: 0.3 10*3/uL (ref 0.0–0.7)
Eosinophils Relative: 5 %
HEMATOCRIT: 35.9 % — AB (ref 36.0–46.0)
HEMOGLOBIN: 12.3 g/dL (ref 12.0–15.0)
LYMPHS PCT: 36 %
Lymphs Abs: 2.3 10*3/uL (ref 0.7–4.0)
MCH: 24.6 pg — ABNORMAL LOW (ref 26.0–34.0)
MCHC: 34.3 g/dL (ref 30.0–36.0)
MCV: 71.8 fL — ABNORMAL LOW (ref 78.0–100.0)
MONOS PCT: 7 %
Monocytes Absolute: 0.5 10*3/uL (ref 0.1–1.0)
NEUTROS ABS: 3.4 10*3/uL (ref 1.7–7.7)
NEUTROS PCT: 52 %
Platelets: 255 10*3/uL (ref 150–400)
RBC: 5 MIL/uL (ref 3.87–5.11)
RDW: 14.4 % (ref 11.5–15.5)
WBC: 6.5 10*3/uL (ref 4.0–10.5)

## 2015-10-12 LAB — BASIC METABOLIC PANEL
ANION GAP: 9 (ref 5–15)
BUN: 15 mg/dL (ref 6–20)
CHLORIDE: 104 mmol/L (ref 101–111)
CO2: 26 mmol/L (ref 22–32)
CREATININE: 0.93 mg/dL (ref 0.44–1.00)
Calcium: 9.7 mg/dL (ref 8.9–10.3)
GFR calc non Af Amer: >60 mL/min (ref >60)
Glucose, Bld: 106 mg/dL — ABNORMAL HIGH (ref 65–99)
POTASSIUM: 4 mmol/L (ref 3.5–5.1)
SODIUM: 139 mmol/L (ref 135–145)

## 2015-10-12 MED ORDER — SODIUM CHLORIDE 0.9 % IV BOLUS (SEPSIS)
1000.0000 mL | Freq: Once | INTRAVENOUS | Status: AC
  Administered 2015-10-12: 1000 mL via INTRAVENOUS

## 2015-10-12 MED ORDER — PROCHLORPERAZINE EDISYLATE 5 MG/ML IJ SOLN
10.0000 mg | Freq: Once | INTRAMUSCULAR | Status: AC
  Administered 2015-10-12: 10 mg via INTRAVENOUS
  Filled 2015-10-12: qty 2

## 2015-10-12 MED ORDER — DIPHENHYDRAMINE HCL 50 MG/ML IJ SOLN
25.0000 mg | Freq: Once | INTRAMUSCULAR | Status: AC
  Administered 2015-10-12: 25 mg via INTRAVENOUS
  Filled 2015-10-12: qty 1

## 2015-10-12 NOTE — ED Provider Notes (Signed)
CSN: [id number]     Arrival date & time 10/12/15  0245 History   First MD Initiated Contact with Patient 10/12/15 0355     Chief Complaint  Patient presents with  . Hypertension     (Consider location/radiation/quality/duration/timing/severity/associated sxs/prior Treatment) Patient is a 60 y.o. female presenting with hypertension and headaches. The history is provided by the patient.  Hypertension Associated symptoms include headaches. Pertinent negatives include no chest pain and no shortness of breath.  Headache Pain location:  R temporal Quality:  Sharp Radiates to:  Does not radiate Severity currently:  6/10 Severity at highest:  9/10 Onset quality:  Gradual Duration:  8 hours Timing:  Constant Progression:  Worsening Similar to prior headaches: no   Relieved by:  Nothing Worsened by:  Nothing Ineffective treatments:  None tried Associated symptoms: no congestion, no dizziness, no fever, no myalgias, no nausea and no vomiting     60 yo F with right sided headache.  States she doesn't normally get headaches.  Started gradually, unsure what makes better or worse.  Denies vomiting, nausea.  Denies photo-phonophobia. Hx of HTN feels that is the cause of her headaches. Recently seen PCP for first time and started on antihypertensives.  Past Medical History  Diagnosis Date  . Anemia   . Hypertension   . Wears glasses   . Snores   . Obesity    Past Surgical History  Procedure Laterality Date  . Total abdominal hysterectomy  01/21/2002  . Breast surgery      br bx-lt  . Colonoscopy    . Cyst removal trunk Left 05/09/2013    Procedure: EXCISION OF UPPER BACK SEBACEOUS CYST REMOVAL ;  Surgeon: Atilano InaEric M Wilson, MD;  Location: [facility];  Service: General;  Laterality: Left;   No family history on file. Social History  Substance Use Topics  . Smoking status: Never Smoker   . Smokeless tobacco: Never Used  . Alcohol Use: No   OB History    No data available      Review of Systems  Constitutional: Negative for fever and chills.  HENT: Negative for congestion and rhinorrhea.   Eyes: Negative for redness and visual disturbance.  Respiratory: Negative for shortness of breath and wheezing.   Cardiovascular: Negative for chest pain and palpitations.  Gastrointestinal: Negative for nausea and vomiting.  Genitourinary: Negative for dysuria and urgency.  Musculoskeletal: Negative for myalgias and arthralgias.  Skin: Negative for pallor and wound.  Neurological: Positive for headaches. Negative for dizziness.      Allergies  Amlodipine  Home Medications   Prior to Admission medications   Medication Sig Start Date End Date Taking? Authorizing Provider  losartan-hydrochlorothiazide (HYZAAR) 100-25 MG per tablet Take 1 tablet by mouth daily.  02/01/13  Yes Historical Provider, MD  metoprolol tartrate (LOPRESSOR) 25 MG tablet Take 25 mg by mouth 2 (two) times daily.   Yes Historical Provider, MD  Multiple Vitamin (MULTIVITAMIN WITH MINERALS) TABS tablet Take 1 tablet by mouth daily.   Yes Historical Provider, MD  oxyCODONE-acetaminophen (ROXICET) 5-325 MG per tablet Take 1-2 tablets by mouth every 4 (four) hours as needed for pain. Patient not taking: Reported on 10/12/2015 05/09/13   Gaynelle AduEric Wilson, MD   BP 146/90 mmHg  Pulse 48  Temp(Src) 98.6 F (37 C) (Oral)  Resp 16  Ht 5\' 4"  (1.626 m)  Wt 241 lb (109.317 kg)  BMI 41.35 kg/m2  SpO2 97% Physical Exam  Constitutional: She is oriented to person,  place, and time. She appears well-developed and well-nourished. No distress.  HENT:  Head: Normocephalic and atraumatic.  Eyes: EOM are normal. Pupils are equal, round, and reactive to light.  Neck: Normal range of motion. Neck supple.  Cardiovascular: Normal rate and regular rhythm.  Exam reveals no gallop and no friction rub.   No murmur heard. Pulmonary/Chest: Effort normal. She has no wheezes. She has no rales.  Abdominal: Soft. She exhibits no  distension. There is no tenderness.  Musculoskeletal: She exhibits no edema or tenderness.  Neurological: She is alert and oriented to person, place, and time. She has normal strength. No cranial nerve deficit or sensory deficit. She displays a negative Romberg sign. GCS eye subscore is 4. GCS verbal subscore is 5. GCS motor subscore is 6. She displays no Babinski's sign on the right side. She displays no Babinski's sign on the left side.  Reflex Scores:      Tricep reflexes are 2+ on the right side and 2+ on the left side.      Bicep reflexes are 2+ on the right side and 2+ on the left side.      Brachioradialis reflexes are 2+ on the right side and 2+ on the left side.      Patellar reflexes are 2+ on the right side and 2+ on the left side.      Achilles reflexes are 2+ on the right side and 2+ on the left side. Skin: Skin is warm and dry. She is not diaphoretic.  Psychiatric: She has a normal mood and affect. Her behavior is normal.  Nursing note and vitals reviewed.   ED Course  Procedures (including critical care time) Labs Review Labs Reviewed  CBC WITH DIFFERENTIAL/PLATELET - Abnormal; Notable for the following:    HCT 35.9 (*)    MCV 71.8 (*)    MCH 24.6 (*)    All other components within normal limits  BASIC METABOLIC PANEL - Abnormal; Notable for the following:    Glucose, Bld 106 (*)    All other components within normal limits    Imaging Review Ct Head Wo Contrast  10/12/2015  CLINICAL DATA:  Acute onset of high blood pressure. Headache and mild nausea. Initial encounter. EXAM: CT HEAD WITHOUT CONTRAST TECHNIQUE: Contiguous axial images were obtained from the base of the skull through the vertex without intravenous contrast. COMPARISON:  None. FINDINGS: There is no evidence of acute infarction, mass lesion, or intra- or extra-axial hemorrhage on CT. The posterior fossa, including the cerebellum, brainstem and fourth ventricle, is within normal limits. The third and lateral  ventricles, and basal ganglia are unremarkable in appearance. The cerebral hemispheres are symmetric in appearance, with normal gray-white differentiation. No mass effect or midline shift is seen. There is no evidence of fracture; visualized osseous structures are unremarkable in appearance. The visualized portions of the orbits are within normal limits. The paranasal sinuses and mastoid air cells are well-aerated. No significant soft tissue abnormalities are seen. IMPRESSION: Unremarkable noncontrast CT of the head. Electronically Signed   By: Roanna Raider M.D.   On: 10/12/2015 06:13   I have personally reviewed and evaluated these images and lab results as part of my medical decision-making.   EKG Interpretation None      MDM   Final diagnoses:  Acute nonintractable headache, unspecified headache type    59 yo F with headache.  Started about 5 hours ago.  Well appearing non toxic.  CT head negative, BP and pain completely  improved with treatment of her headache.  Neuro exam benign. D/c home.  8:00 PM:  I have discussed the diagnosis/risks/treatment options with the patient and family and believe the pt to be eligible for discharge home to follow-up with PCP. We also discussed returning to the ED immediately if new or worsening sx occur. We discussed the sx which are most concerning (e.g., sudden worsening pain, fever, inability to tolerate by mouth) that necessitate immediate return. Medications administered to the patient during their visit and any new prescriptions provided to the patient are listed below.  Medications given during this visit Medications  prochlorperazine (COMPAZINE) injection 10 mg (10 mg Intravenous Given 10/12/15 0519)  diphenhydrAMINE (BENADRYL) injection 25 mg (25 mg Intravenous Given 10/12/15 0519)  sodium chloride 0.9 % bolus 1,000 mL (0 mLs Intravenous Stopped 10/12/15 0714)    Discharge Medication List as of 10/12/2015  7:08 AM      The patient appears reasonably  screen and/or stabilized for discharge and I doubt any other medical condition or other [facility] requiring further screening, evaluation, or treatment in the ED at this time prior to discharge.      Melene Plan, DO 10/12/15 2000

## 2015-10-12 NOTE — ED Notes (Signed)
Pt. reports elevated blood pressure at home this evening 197/124 with headache and mild nausea .

## 2015-10-12 NOTE — Discharge Instructions (Signed)

## 2015-10-12 NOTE — ED Notes (Signed)
Taken to CT at this time. 

## 2016-09-27 ENCOUNTER — Encounter (HOSPITAL_COMMUNITY): Payer: Self-pay | Admitting: Emergency Medicine

## 2016-09-27 ENCOUNTER — Emergency Department (HOSPITAL_COMMUNITY)
Admission: EM | Admit: 2016-09-27 | Discharge: 2016-09-27 | Disposition: A | Discharge status: Home / Self Care | Payer: No Typology Code available for payment source | Attending: Emergency Medicine | Admitting: Emergency Medicine

## 2016-09-27 ENCOUNTER — Emergency Department (HOSPITAL_COMMUNITY): Payer: No Typology Code available for payment source

## 2016-09-27 DIAGNOSIS — Y92410 Unspecified street and highway as the place of occurrence of the external cause: Secondary | ICD-10-CM | POA: Insufficient documentation

## 2016-09-27 DIAGNOSIS — Y9301 Activity, walking, marching and hiking: Secondary | ICD-10-CM | POA: Diagnosis not present

## 2016-09-27 DIAGNOSIS — S99911A Unspecified injury of right ankle, initial encounter: Secondary | ICD-10-CM | POA: Diagnosis present

## 2016-09-27 DIAGNOSIS — Y999 Unspecified external cause status: Secondary | ICD-10-CM | POA: Insufficient documentation

## 2016-09-27 DIAGNOSIS — M25571 Pain in right ankle and joints of right foot: Secondary | ICD-10-CM

## 2016-09-27 MED ORDER — IBUPROFEN 400 MG PO TABS: 400.0000 mg | ORAL_TABLET | Freq: Four times a day (QID) | ORAL | 0 refills | Status: DC | PRN

## 2016-09-27 MED ORDER — IBUPROFEN 400 MG PO TABS
400.0000 mg | ORAL_TABLET | ORAL | Status: DC | PRN
  Administered 2016-09-27: 400 mg via ORAL
  Filled 2016-09-27: qty 1
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
## 2016-09-27 NOTE — Progress Notes (Signed)
Orthopedic Tech Progress Note Patient Details:  Veronica GratesVanessa Schmidt 08/19/1956 [id number]  Ortho Devices Type of Ortho Device: Crutches Ortho Device/Splint Location: Proviced and adjusted Crutches for pt.  Briefly trained pt on use as pt stated they previously used crutches.  Pt ambulated well and family was at bedside to assist.  Ortho Device/Splint Interventions: Adjustment   [name], [name] C 09/27/2016, 12:33 PM

## 2016-09-27 NOTE — ED Notes (Signed)
Patient transported to X-ray 

## 2016-10-13 ENCOUNTER — Ambulatory Visit
Admission: RE | Admit: 2016-10-13 | Discharge: 2016-10-13 | Disposition: A | Discharge status: Home / Self Care | Payer: BC Managed Care – PPO | Source: Ambulatory Visit | Attending: Orthopedic Surgery | Admitting: Orthopedic Surgery

## 2016-10-13 ENCOUNTER — Other Ambulatory Visit: Payer: Self-pay | Admitting: Orthopedic Surgery

## 2016-10-13 DIAGNOSIS — S300XXA Contusion of lower back and pelvis, initial encounter: Secondary | ICD-10-CM

## 2017-03-31 ENCOUNTER — Other Ambulatory Visit: Payer: Self-pay | Admitting: Family Medicine

## 2017-03-31 DIAGNOSIS — Z1231 Encounter for screening mammogram for malignant neoplasm of breast: Secondary | ICD-10-CM

## 2017-04-05 ENCOUNTER — Ambulatory Visit
Admission: RE | Admit: 2017-04-05 | Discharge: 2017-04-05 | Disposition: A | Discharge status: Home / Self Care | Payer: BC Managed Care – PPO | Source: Ambulatory Visit | Attending: Family Medicine | Admitting: Family Medicine

## 2017-04-05 DIAGNOSIS — Z1231 Encounter for screening mammogram for malignant neoplasm of breast: Secondary | ICD-10-CM

## 2017-11-14 DIAGNOSIS — M7061 Trochanteric bursitis, right hip: Secondary | ICD-10-CM | POA: Insufficient documentation

## 2017-11-14 DIAGNOSIS — M545 Low back pain, unspecified: Secondary | ICD-10-CM | POA: Insufficient documentation

## 2018-01-03 DIAGNOSIS — M25561 Pain in right knee: Secondary | ICD-10-CM | POA: Insufficient documentation

## 2018-01-16 DIAGNOSIS — M5416 Radiculopathy, lumbar region: Secondary | ICD-10-CM | POA: Insufficient documentation

## 2018-01-16 DIAGNOSIS — M5136 Other intervertebral disc degeneration, lumbar region: Secondary | ICD-10-CM | POA: Insufficient documentation

## 2019-10-02 ENCOUNTER — Ambulatory Visit: Payer: BC Managed Care – PPO | Attending: Internal Medicine

## 2019-10-02 DIAGNOSIS — Z20822 Contact with and (suspected) exposure to covid-19: Secondary | ICD-10-CM

## 2019-10-04 LAB — NOVEL CORONAVIRUS, NAA: SARS-CoV-2, NAA: NOT DETECTED

## 2019-12-23 ENCOUNTER — Ambulatory Visit: Payer: BC Managed Care – PPO | Attending: Internal Medicine

## 2019-12-23 DIAGNOSIS — Z20822 Contact with and (suspected) exposure to covid-19: Secondary | ICD-10-CM

## 2019-12-24 LAB — NOVEL CORONAVIRUS, NAA: SARS-CoV-2, NAA: NOT DETECTED

## 2020-03-23 ENCOUNTER — Emergency Department (HOSPITAL_BASED_OUTPATIENT_CLINIC_OR_DEPARTMENT_OTHER)
Admission: EM | Admit: 2020-03-23 | Discharge: 2020-03-23 | Disposition: A | Discharge status: Home / Self Care | Payer: BC Managed Care – PPO | Attending: Emergency Medicine | Admitting: Emergency Medicine

## 2020-03-23 ENCOUNTER — Other Ambulatory Visit: Payer: Self-pay

## 2020-03-23 ENCOUNTER — Encounter (HOSPITAL_BASED_OUTPATIENT_CLINIC_OR_DEPARTMENT_OTHER): Payer: Self-pay

## 2020-03-23 ENCOUNTER — Emergency Department (HOSPITAL_BASED_OUTPATIENT_CLINIC_OR_DEPARTMENT_OTHER): Payer: BC Managed Care – PPO

## 2020-03-23 DIAGNOSIS — M79672 Pain in left foot: Secondary | ICD-10-CM | POA: Diagnosis present

## 2020-03-23 DIAGNOSIS — Z79899 Other long term (current) drug therapy: Secondary | ICD-10-CM | POA: Insufficient documentation

## 2020-03-23 DIAGNOSIS — I10 Essential (primary) hypertension: Secondary | ICD-10-CM | POA: Diagnosis not present

## 2020-03-23 DIAGNOSIS — M7732 Calcaneal spur, left foot: Secondary | ICD-10-CM | POA: Diagnosis not present

## 2020-03-23 MED ORDER — HYDROCHLOROTHIAZIDE 25 MG PO TABS
25.0000 mg | ORAL_TABLET | Freq: Once | ORAL | Status: AC
  Administered 2020-03-23: 25 mg via ORAL
  Filled 2020-03-23: qty 1

## 2020-03-23 MED ORDER — LOSARTAN POTASSIUM 25 MG PO TABS
100.0000 mg | ORAL_TABLET | Freq: Once | ORAL | Status: AC
  Administered 2020-03-23: 100 mg via ORAL
  Filled 2020-03-23: qty 4

## 2020-03-23 NOTE — ED Provider Notes (Signed)
[facility] EMERGENCY DEPARTMENT Provider Note   CSN: [id number] Arrival date & time: 03/23/20  1151     History Chief Complaint  Patient presents with  . Foot Pain    Veronica Schmidt is a 64 y.o. female.  Veronica Schmidt is a 64 y.o. female with a history of hypertension, obesity, anemia, who presents to the ED for evaluation of left foot pain since Friday.  She reports she was up on her feet most of the day working in the garden on Friday and Friday night noticed pain to the back of her heel, she reports that over the weekend pain continue to worsen it became difficult to walk on the foot or wear shoes that touch the back of her foot.  She looked her foot over closely but did not notice any redness, or bites.  She noticed that she could not stand to rest the back of her foot on the bed at night, no prior history of the same.  Reports she occasionally has some pain on the bottom of her heel.  No swelling or color change noted.  No pain in the ankle.  Patient noted to be hypertensive on arrival, states that she supposed to be on losartan HCTZ but has not taken her medication in over a year, currently denies any chest pain, shortness of breath, dizziness, headaches, vision changes, lower extremity swelling, numbness or weakness.  Her daughter has already called to get her an appointment scheduled with her PCP and they called in refills of her blood pressure medication today.        Past Medical History:  Diagnosis Date  . Anemia   . Hypertension   . Obesity   . Snores   . Wears glasses     Patient Active Problem List   Diagnosis Date Noted  . Epidermal inclusion cyst 04/24/2013    Past Surgical History:  Procedure Laterality Date  . BREAST SURGERY     br bx-lt  . COLONOSCOPY    . CYST REMOVAL TRUNK Left 05/09/2013   Procedure: EXCISION OF UPPER BACK SEBACEOUS CYST REMOVAL ;  Surgeon: Atilano Ina, MD;  Location: [facility];  Service: General;   Laterality: Left;  . TOTAL ABDOMINAL HYSTERECTOMY  01/21/2002     OB History   No obstetric history on file.     No family history on file.  Social History   Tobacco Use  . Smoking status: Never Smoker  . Smokeless tobacco: Never Used  Substance Use Topics  . Alcohol use: No  . Drug use: No    Home Medications Prior to Admission medications   Medication Sig Start Date End Date Taking? Authorizing Provider  ibuprofen (ADVIL,MOTRIN) 400 MG tablet Take 1 tablet (400 mg total) by mouth every 6 (six) hours as needed. 09/27/16   Mesner, Barbara Cower, MD  losartan-hydrochlorothiazide (HYZAAR) 100-25 MG per tablet Take 1 tablet by mouth daily.  02/01/13   [provider]  losartan-hydrochlorothiazide (HYZAAR) 100-25 MG tablet Take 1 tablet by mouth daily.    [provider]  metoprolol tartrate (LOPRESSOR) 25 MG tablet Take 25 mg by mouth 2 (two) times daily.    [provider]  metoprolol tartrate (LOPRESSOR) 25 MG tablet Take 25 mg by mouth 2 (two) times daily.    [provider]  Multiple Vitamin (MULTIVITAMIN WITH MINERALS) TABS tablet Take 1 tablet by mouth daily.    [provider]  Multiple Vitamins-Minerals (MULTIVITAMIN WITH MINERALS) tablet  Take 1 tablet by mouth daily.    [provider]  oxyCODONE-acetaminophen (ROXICET) 5-325 MG per tablet Take 1-2 tablets by mouth every 4 (four) hours as needed for pain. Patient not taking: Reported on 10/12/2015 05/09/13   Gaynelle Adu, MD    Allergies    Amlodipine  Review of Systems   Review of Systems  Constitutional: Negative for chills and fever.  Eyes: Negative for visual disturbance.  Respiratory: Negative for cough and shortness of breath.   Cardiovascular: Negative for chest pain.  Musculoskeletal:       Pain over left heel  Skin: Negative for color change and rash.  Neurological: Negative for weakness, numbness and headaches.  All other systems reviewed and are  negative.   Physical Exam Updated Vital Signs BP (!) 206/105 (BP Location: Right Leg)   Pulse 66   Temp 98.6 F (37 C) (Oral)   Resp 16   Ht 5\' 4"  (1.626 m)   Wt 113.4 kg   SpO2 99%   BMI 42.91 kg/m   Physical Exam Vitals and nursing note reviewed.  Constitutional:      General: She is not in acute distress.    Appearance: Normal appearance. She is well-developed. She is obese. She is not diaphoretic.     Comments: Well-appearing and in no distress  HENT:     Head: Normocephalic and atraumatic.  Eyes:     General:        Right eye: No discharge.        Left eye: No discharge.  Pulmonary:     Effort: Pulmonary effort is normal. No respiratory distress.  Musculoskeletal:     Comments: Focal tenderness over the posterior left calcaneus that repeatedly reproduces patient's pain there is no overlying skin change, redness or warmth in this area, no swelling or deformity.  No wounds to the bottom of the foot.  Range of motion at the ankle is intact and there is no focal tenderness over the Achilles.  2+ DP and TP pulses, normal sensation and strength.  Skin:    General: Skin is warm and dry.  Neurological:     Mental Status: She is alert and oriented to person, place, and time.     Coordination: Coordination normal.  Psychiatric:        Mood and Affect: Mood normal.        Behavior: Behavior normal.     ED Results / Procedures / Treatments   Labs (all labs ordered are listed, but only abnormal results are displayed) Labs Reviewed - No data to display  EKG None  Radiology DG Foot Complete Left  Result Date: 03/23/2020 CLINICAL DATA:  Left heel pain with weight-bearing. No known injury. EXAM: LEFT FOOT - COMPLETE 3+ VIEW COMPARISON:  None. FINDINGS: There is no evidence of fracture or dislocation. Minimal arthritis at the first MTP joint. Tiny posterior and plantar calcaneal enthesophytes. Slight dorsal spurring on the distal talus. Soft tissues are unremarkable.  IMPRESSION: Minimal arthritis of the first MTP joint. Tiny calcaneal enthesophytes. Electronically Signed   By: 03/25/2020 M.D.   On: 03/23/2020 13:09    Procedures Procedures (including critical care time)  Medications Ordered in ED Medications  losartan (COZAAR) tablet 100 mg (100 mg Oral Given 03/23/20 1311)  hydrochlorothiazide (HYDRODIURIL) tablet 25 mg (25 mg Oral Given 03/23/20 1315)    ED Course  I have reviewed the triage vital signs and the nursing notes.  Pertinent labs & imaging results that were  available during my care of the patient were reviewed by me and considered in my medical decision making (see chart for details).    MDM Rules/Calculators/A&P                         64 year old female presents with left foot pain since spending most of the day on Friday working in her garden.  Pain is located over the posterior aspect of the calcaneus and is worse with walking palpation or any pressure in this area, no overlying skin changes or signs of infection.  The foot is neurovascularly intact.  X-ray shows calcaneal spur in this location as well as 1 over the plantar surface that explains patient's pain.  We will have her treat with NSAIDs, Tylenol, I have asked her to purchase heel cups, and follow-up with podiatry.  Return precautions discussed.  Patient and her daughter expressed understanding and agreement.  Pt's blood pressure was elevated today, pt has hx of HTN, not taking their medications for over a year but her daughter called today and got her medications refilled and has an upcoming appointment with her PCP, pt is not exhibiting any symptoms to suggest hypertensive urgency or emergency today, given dose of her home BP meds, will have pt follow up with their PCP in 1 week for blood pressure check. Discussed long term consequences of untreated hypertension with the patient.  Final Clinical Impression(s) / ED Diagnoses Final diagnoses:  Heel spur, left    Rx / DC  Orders ED Discharge Orders    None       Janet Berlin 03/23/20 Tora Duck, MD 03/24/20 [phone]

## 2020-03-23 NOTE — ED Triage Notes (Signed)
Pt arrives with c/o foot pain to left foot since Friday, denies injury. Pt also reports that she has not been taking her BP medications.

## 2020-03-23 NOTE — Discharge Instructions (Signed)
Use ibuprofen and Tylenol to help treat pain, crutches as needed until inflammation improves and you are able to bear weight.  You can purchase heel cup inserts for your shoes to help give extra padding and improve pain.  Please call to schedule follow-up appointment with podiatry.  Your blood pressure was elevated today, take your blood pressure medication daily, please follow up with your primary doctor in 1 week for blood pressure recheck.

## 2020-03-23 NOTE — ED Notes (Signed)
Left heel pain w weight bearing  Denies injury

## 2020-08-10 ENCOUNTER — Ambulatory Visit (INDEPENDENT_AMBULATORY_CARE_PROVIDER_SITE_OTHER): Payer: BC Managed Care – PPO

## 2020-08-10 ENCOUNTER — Ambulatory Visit: Payer: BC Managed Care – PPO | Admitting: Podiatry

## 2020-08-10 ENCOUNTER — Other Ambulatory Visit: Payer: Self-pay

## 2020-08-10 DIAGNOSIS — S9001XA Contusion of right ankle, initial encounter: Secondary | ICD-10-CM

## 2020-08-10 DIAGNOSIS — M2141 Flat foot [pes planus] (acquired), right foot: Secondary | ICD-10-CM

## 2020-08-10 DIAGNOSIS — M2142 Flat foot [pes planus] (acquired), left foot: Secondary | ICD-10-CM

## 2020-08-10 DIAGNOSIS — S96911A Strain of unspecified muscle and tendon at ankle and foot level, right foot, initial encounter: Secondary | ICD-10-CM

## 2020-08-10 DIAGNOSIS — M25571 Pain in right ankle and joints of right foot: Secondary | ICD-10-CM

## 2020-08-10 DIAGNOSIS — G8929 Other chronic pain: Secondary | ICD-10-CM

## 2020-08-10 DIAGNOSIS — M779 Enthesopathy, unspecified: Secondary | ICD-10-CM

## 2020-08-10 MED ORDER — METHYLPREDNISOLONE 4 MG PO TBPK: ORAL_TABLET | ORAL | 0 refills | Status: DC
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
## 2020-08-19 ENCOUNTER — Other Ambulatory Visit: Payer: Self-pay | Admitting: Podiatry

## 2020-08-23 ENCOUNTER — Ambulatory Visit
Admission: RE | Admit: 2020-08-23 | Discharge: 2020-08-23 | Disposition: A | Discharge status: Home / Self Care | Payer: BC Managed Care – PPO | Source: Ambulatory Visit | Attending: Podiatry | Admitting: Podiatry

## 2020-08-23 ENCOUNTER — Other Ambulatory Visit: Payer: Self-pay

## 2020-08-23 DIAGNOSIS — S96911A Strain of unspecified muscle and tendon at ankle and foot level, right foot, initial encounter: Secondary | ICD-10-CM

## 2020-08-27 NOTE — Progress Notes (Signed)
done

## 2020-09-15 ENCOUNTER — Other Ambulatory Visit: Payer: Self-pay

## 2020-09-15 ENCOUNTER — Ambulatory Visit: Payer: BC Managed Care – PPO | Admitting: Podiatry

## 2020-09-15 DIAGNOSIS — G8929 Other chronic pain: Secondary | ICD-10-CM

## 2020-09-15 DIAGNOSIS — M779 Enthesopathy, unspecified: Secondary | ICD-10-CM | POA: Diagnosis not present

## 2020-09-15 DIAGNOSIS — M9260 Juvenile osteochondrosis of tarsus, unspecified ankle: Secondary | ICD-10-CM | POA: Diagnosis not present

## 2020-09-15 DIAGNOSIS — M2141 Flat foot [pes planus] (acquired), right foot: Secondary | ICD-10-CM | POA: Diagnosis not present

## 2020-09-15 DIAGNOSIS — M2142 Flat foot [pes planus] (acquired), left foot: Secondary | ICD-10-CM

## 2020-09-15 DIAGNOSIS — M25571 Pain in right ankle and joints of right foot: Secondary | ICD-10-CM

## 2020-09-15 MED ORDER — MELOXICAM 7.5 MG PO TABS: 7.5000 mg | ORAL_TABLET | Freq: Every day | ORAL | 0 refills | Status: DC

## 2020-09-21 NOTE — Progress Notes (Signed)
Subjective: 64 year old female presents the office today for follow-up evaluation of right ankle pain and also discuss MRI results.  She states that she was wearing the ankle brace for about 1 month but is causing more irritation so he stopped wearing it.  She denies recent injury or falls or changes otherwise since I last saw her and she has no other concerns today. Denies any systemic complaints such as fevers, chills, nausea, vomiting. No acute changes since last appointment, and no other complaints at this time.   Objective: AAO x3, NAD DP/PT pulses palpable bilaterally, CRT less than 3 seconds The majority of tenderness is palpation along the peroneal tendon on the distal portion Achilles tendon.  Flexor, extensor tendons appear to be intact.  There is no edema, erythema.  MMT 5/5. No pain with calf compression, swelling, warmth, erythema  Assessment: Tendinitis  Plan: -All treatment options discussed with the patient including all alternatives, risks, complications.  -I reviewed the MRI with her.  There is mild insertional Achilles tendinosis with small Haglund's deformity, bone marrow edema.  Some arthritic changes are also noted. -Given her symptoms recommended referral to physical therapy which was placed today for [facility].  We will check orthotic coverage and also recommend compression socks and a prescription was given. -Patient encouraged to call the office with any questions, concerns, change in symptoms.   Vivi Barrack DPM

## 2020-09-22 NOTE — Addendum Note (Signed)
Addended by: Jacklynn Bue on: 09/22/2020 08:06 AM   Modules accepted: Orders

## 2020-09-28 ENCOUNTER — Ambulatory Visit (INDEPENDENT_AMBULATORY_CARE_PROVIDER_SITE_OTHER): Payer: BC Managed Care – PPO | Admitting: Orthotics

## 2020-09-28 ENCOUNTER — Other Ambulatory Visit: Payer: Self-pay

## 2020-09-28 DIAGNOSIS — M2142 Flat foot [pes planus] (acquired), left foot: Secondary | ICD-10-CM

## 2020-09-28 DIAGNOSIS — M2141 Flat foot [pes planus] (acquired), right foot: Secondary | ICD-10-CM | POA: Diagnosis not present

## 2020-09-28 NOTE — Progress Notes (Signed)
Patient cast today for CMFO to address pes planus condition and secondary peroneal tendonitis and achilles tendonopathy.   Plan on device with deep heel cup and lift.

## 2020-10-26 ENCOUNTER — Ambulatory Visit: Payer: BC Managed Care – PPO | Admitting: Podiatry

## 2020-10-26 ENCOUNTER — Encounter: Payer: BC Managed Care – PPO | Admitting: Orthotics

## 2020-11-03 ENCOUNTER — Emergency Department (HOSPITAL_BASED_OUTPATIENT_CLINIC_OR_DEPARTMENT_OTHER)
Admission: EM | Admit: 2020-11-03 | Discharge: 2020-11-03 | Disposition: A | Discharge status: Home / Self Care | Payer: BC Managed Care – PPO | Attending: Emergency Medicine | Admitting: Emergency Medicine

## 2020-11-03 ENCOUNTER — Encounter (HOSPITAL_BASED_OUTPATIENT_CLINIC_OR_DEPARTMENT_OTHER): Payer: Self-pay

## 2020-11-03 ENCOUNTER — Other Ambulatory Visit: Payer: Self-pay

## 2020-11-03 ENCOUNTER — Other Ambulatory Visit: Payer: Self-pay | Admitting: Physician Assistant

## 2020-11-03 DIAGNOSIS — R109 Unspecified abdominal pain: Secondary | ICD-10-CM | POA: Diagnosis present

## 2020-11-03 DIAGNOSIS — R110 Nausea: Secondary | ICD-10-CM | POA: Diagnosis not present

## 2020-11-03 DIAGNOSIS — Z5321 Procedure and treatment not carried out due to patient leaving prior to being seen by health care provider: Secondary | ICD-10-CM | POA: Diagnosis not present

## 2020-11-03 DIAGNOSIS — R1032 Left lower quadrant pain: Secondary | ICD-10-CM

## 2020-11-03 DIAGNOSIS — R944 Abnormal results of kidney function studies: Secondary | ICD-10-CM | POA: Insufficient documentation

## 2020-11-03 LAB — URINALYSIS, ROUTINE W REFLEX MICROSCOPIC
Bilirubin Urine: NEGATIVE
Glucose, UA: NEGATIVE mg/dL
Hgb urine dipstick: NEGATIVE
Ketones, ur: 40 mg/dL — AB
Nitrite: NEGATIVE
Protein, ur: NEGATIVE mg/dL
Specific Gravity, Urine: 1.03 (ref 1.005–1.030)
pH: 5 (ref 5.0–8.0)

## 2020-11-03 LAB — CBC
HCT: 38.1 % (ref 36.0–46.0)
Hemoglobin: 13.1 g/dL (ref 12.0–15.0)
MCH: 25 pg — ABNORMAL LOW (ref 26.0–34.0)
MCHC: 34.4 g/dL (ref 30.0–36.0)
MCV: 72.7 fL — ABNORMAL LOW (ref 80.0–100.0)
Platelets: 262 10*3/uL (ref 150–400)
RBC: 5.24 MIL/uL — ABNORMAL HIGH (ref 3.87–5.11)
RDW: 14.2 % (ref 11.5–15.5)
WBC: 7 10*3/uL (ref 4.0–10.5)
nRBC: 0 % (ref 0.0–0.2)

## 2020-11-03 LAB — COMPREHENSIVE METABOLIC PANEL
ALT: 21 U/L (ref 0–44)
AST: 24 U/L (ref 15–41)
Albumin: 4.4 g/dL (ref 3.5–5.0)
Alkaline Phosphatase: 68 U/L (ref 38–126)
Anion gap: 14 (ref 5–15)
BUN: 35 mg/dL — ABNORMAL HIGH (ref 8–23)
CO2: 23 mmol/L (ref 22–32)
Calcium: 10 mg/dL (ref 8.9–10.3)
Chloride: 100 mmol/L (ref 98–111)
Creatinine, Ser: 1.93 mg/dL — ABNORMAL HIGH (ref 0.44–1.00)
GFR, Estimated: 29 mL/min — ABNORMAL LOW (ref >60)
Glucose, Bld: 85 mg/dL (ref 70–99)
Potassium: 3.8 mmol/L (ref 3.5–5.1)
Sodium: 137 mmol/L (ref 135–145)
Total Bilirubin: 0.9 mg/dL (ref 0.3–1.2)
Total Protein: 8.6 g/dL — ABNORMAL HIGH (ref 6.5–8.1)

## 2020-11-03 LAB — URINALYSIS, MICROSCOPIC (REFLEX)

## 2020-11-03 LAB — LIPASE, BLOOD: Lipase: 32 U/L (ref 11–51)

## 2020-11-03 NOTE — ED Triage Notes (Signed)
Pt states was sent here by gastroenterologist for decreased kidney function. For the past 2 months she had felt nauseous and slight abdominal discomfort.

## 2020-11-04 ENCOUNTER — Encounter (HOSPITAL_BASED_OUTPATIENT_CLINIC_OR_DEPARTMENT_OTHER): Payer: Self-pay

## 2020-11-04 ENCOUNTER — Other Ambulatory Visit: Payer: Self-pay

## 2020-11-04 ENCOUNTER — Ambulatory Visit: Payer: Self-pay | Admitting: Orthotics

## 2020-11-04 ENCOUNTER — Emergency Department (HOSPITAL_BASED_OUTPATIENT_CLINIC_OR_DEPARTMENT_OTHER)
Admission: EM | Admit: 2020-11-04 | Discharge: 2020-11-04 | Disposition: A | Discharge status: Home / Self Care | Payer: BC Managed Care – PPO | Attending: Emergency Medicine | Admitting: Emergency Medicine

## 2020-11-04 DIAGNOSIS — N179 Acute kidney failure, unspecified: Secondary | ICD-10-CM | POA: Insufficient documentation

## 2020-11-04 DIAGNOSIS — E860 Dehydration: Secondary | ICD-10-CM | POA: Insufficient documentation

## 2020-11-04 DIAGNOSIS — Z79899 Other long term (current) drug therapy: Secondary | ICD-10-CM | POA: Insufficient documentation

## 2020-11-04 DIAGNOSIS — M2141 Flat foot [pes planus] (acquired), right foot: Secondary | ICD-10-CM

## 2020-11-04 DIAGNOSIS — I10 Essential (primary) hypertension: Secondary | ICD-10-CM | POA: Diagnosis not present

## 2020-11-04 DIAGNOSIS — R110 Nausea: Secondary | ICD-10-CM | POA: Diagnosis present

## 2020-11-04 DIAGNOSIS — G8929 Other chronic pain: Secondary | ICD-10-CM

## 2020-11-04 LAB — BASIC METABOLIC PANEL
Anion gap: 11 (ref 5–15)
Anion gap: 10 (ref 5–15)
BUN: 32 mg/dL — ABNORMAL HIGH (ref 8–23)
BUN: 29 mg/dL — ABNORMAL HIGH (ref 8–23)
CO2: 26 mmol/L (ref 22–32)
CO2: 24 mmol/L (ref 22–32)
Calcium: 9.6 mg/dL (ref 8.9–10.3)
Calcium: 9.6 mg/dL (ref 8.9–10.3)
Chloride: 101 mmol/L (ref 98–111)
Chloride: 103 mmol/L (ref 98–111)
Creatinine, Ser: 1.54 mg/dL — ABNORMAL HIGH (ref 0.44–1.00)
Creatinine, Ser: 1.33 mg/dL — ABNORMAL HIGH (ref 0.44–1.00)
GFR, Estimated: 38 mL/min — ABNORMAL LOW (ref >60)
GFR, Estimated: 45 mL/min — ABNORMAL LOW (ref >60)
Glucose, Bld: 99 mg/dL (ref 70–99)
Glucose, Bld: 95 mg/dL (ref 70–99)
Potassium: 3.5 mmol/L (ref 3.5–5.1)
Potassium: 3.6 mmol/L (ref 3.5–5.1)
Sodium: 138 mmol/L (ref 135–145)
Sodium: 137 mmol/L (ref 135–145)

## 2020-11-04 LAB — CBC WITH DIFFERENTIAL/PLATELET
Abs Immature Granulocytes: 0.02 10*3/uL (ref 0.00–0.07)
Basophils Absolute: 0 10*3/uL (ref 0.0–0.1)
Basophils Relative: 1 %
Eosinophils Absolute: 0.2 10*3/uL (ref 0.0–0.5)
Eosinophils Relative: 3 %
HCT: 37.3 % (ref 36.0–46.0)
Hemoglobin: 12.4 g/dL (ref 12.0–15.0)
Immature Granulocytes: 0 %
Lymphocytes Relative: 36 %
Lymphs Abs: 2.1 10*3/uL (ref 0.7–4.0)
MCH: 24.4 pg — ABNORMAL LOW (ref 26.0–34.0)
MCHC: 33.2 g/dL (ref 30.0–36.0)
MCV: 73.3 fL — ABNORMAL LOW (ref 80.0–100.0)
Monocytes Absolute: 0.6 10*3/uL (ref 0.1–1.0)
Monocytes Relative: 10 %
Neutro Abs: 2.8 10*3/uL (ref 1.7–7.7)
Neutrophils Relative %: 50 %
Platelets: 248 10*3/uL (ref 150–400)
RBC: 5.09 MIL/uL (ref 3.87–5.11)
RDW: 14.1 % (ref 11.5–15.5)
WBC: 5.7 10*3/uL (ref 4.0–10.5)
nRBC: 0 % (ref 0.0–0.2)

## 2020-11-04 MED ORDER — BLISTEX MEDICATED EX OINT
TOPICAL_OINTMENT | CUTANEOUS | Status: AC
  Filled 2020-11-04: qty 6.3

## 2020-11-04 MED ORDER — SODIUM CHLORIDE 0.9 % IV BOLUS
1000.0000 mL | Freq: Once | INTRAVENOUS | Status: AC
  Administered 2020-11-04: 1000 mL via INTRAVENOUS

## 2020-11-04 MED ORDER — LACTATED RINGERS IV BOLUS
1000.0000 mL | Freq: Once | INTRAVENOUS | Status: AC
  Administered 2020-11-04: 1000 mL via INTRAVENOUS

## 2020-11-04 NOTE — Progress Notes (Signed)
Patient picked up f/o and was pleased with fit, comfort, and function.  Worked well with footwear.  Told of rbeak in period and how to report any issues.  

## 2020-11-04 NOTE — ED Provider Notes (Signed)
[facility] EMERGENCY DEPARTMENT Provider Note   CSN: [id number] Arrival date & time: 11/04/20  3664     History Chief Complaint  Patient presents with  . Abnormal Lab    Veronica Schmidt is a 65 y.o. female w PMHx HTN, presenting to the ED with complaint of abnormal kidney function. Her GI doctor checked her blood work yesterday and noted elevated creatinine and low GFR. She reports last time she had her kidney function checked was in November 2021 and provided pictures of lab results on her phone: Cr 1.02, GFR 58.68, BUN 17. She reports she has been fasting for her religious beliefs since Jan 16. She has only been taking in about 1.5 bottles of water per day. She was told to stop fasting and therefore drank pedialyte and water and ate food yesterday after checking into this ED and having blood drawn, but LWBS due to wait times.  She reports she initially side GI for multiple months of gradually worsening nausea.  They have scheduled her for outpatient CT scan. Her symptoms are not related to meals.   The history is provided by the patient and medical records.       Past Medical History:  Diagnosis Date  . Anemia   . Hypertension   . Obesity   . Snores   . Wears glasses     Patient Active Problem List   Diagnosis Date Noted  . Degeneration of lumbar intervertebral disc 01/16/2018  . Lumbar radiculopathy 01/16/2018  . Pain in right knee 01/03/2018  . Low back pain 11/14/2017  . Trochanteric bursitis of right hip 11/14/2017  . Epidermal inclusion cyst 04/24/2013    Past Surgical History:  Procedure Laterality Date  . BREAST SURGERY     br bx-lt  . COLONOSCOPY    . CYST REMOVAL TRUNK Left 05/09/2013   Procedure: EXCISION OF UPPER BACK SEBACEOUS CYST REMOVAL ;  Surgeon: Atilano Ina, MD;  Location: [facility];  Service: General;  Laterality: Left;  . TOTAL ABDOMINAL HYSTERECTOMY  01/21/2002     OB History   No obstetric history on file.      No family history on file.  Social History   Tobacco Use  . Smoking status: Never Smoker  . Smokeless tobacco: Never Used  Substance Use Topics  . Alcohol use: No  . Drug use: No    Home Medications Prior to Admission medications   Medication Sig Start Date End Date Taking? Authorizing Provider  verapamil (VERELAN PM) 240 MG 24 hr capsule Take 240 mg by mouth at bedtime.   Yes [provider]  amLODipine (NORVASC) 10 MG tablet amlodipine    [provider]  amLODIPine Besylate (NORVASC PO) amlodipine    [provider]  diclofenac (CATAFLAM) 50 MG tablet diclofenac potassium 50 mg tablet    [provider]  hydrochlorothiazide (HYDRODIURIL) 25 MG tablet Take 25 mg by mouth daily. 07/29/20   [provider]  hydrochlorothiazide (MICROZIDE) 12.5 MG capsule hydrochlorothiazide    [provider]  ibuprofen (ADVIL,MOTRIN) 400 MG tablet Take 1 tablet (400 mg total) by mouth every 6 (six) hours as needed. 09/27/16   Mesner, Barbara Cower, MD  losartan (COZAAR) 100 MG tablet Take 100 mg by mouth daily. 07/29/20   [provider]  losartan-hydrochlorothiazide (HYZAAR) 100-25 MG per tablet Take 1 tablet by mouth daily.  02/01/13   [provider]  losartan-hydrochlorothiazide (HYZAAR) 100-25 MG tablet Take 1 tablet by mouth daily.  [provider]  meloxicam (MOBIC) 7.5 MG tablet Take 1 tablet (7.5 mg total) by mouth daily. 09/15/20   Vivi Barrack, DPM  methylPREDNISolone (MEDROL DOSEPAK) 4 MG TBPK tablet Take as directed 08/10/20   Vivi Barrack, DPM  metoprolol tartrate (LOPRESSOR) 25 MG tablet Take 25 mg by mouth 2 (two) times daily.    [provider]  metoprolol tartrate (LOPRESSOR) 25 MG tablet Take 25 mg by mouth 2 (two) times daily.    [provider]  Multiple Vitamin (MULTIVITAMIN WITH MINERALS) TABS tablet Take 1 tablet by mouth daily.    [provider]  Multiple  Vitamins-Minerals (MULTIVITAMIN WITH MINERALS) tablet Take 1 tablet by mouth daily.    [provider]  oxyCODONE-acetaminophen (ROXICET) 5-325 MG per tablet Take 1-2 tablets by mouth every 4 (four) hours as needed for pain. Patient not taking: No sig reported 05/09/13   Gaynelle Adu, MD  predniSONE (DELTASONE) 10 MG tablet prednisone 10 mg tablet  Take 6 tablets po x1 day, 5 tablets po x 1 day, 4 tablets po x 1 day, 3 tablets po x 1 day, 2 tablets po x 1 day, 1 tablet po x 1 day    [provider]  traMADol (ULTRAM) 50 MG tablet tramadol 50 mg tablet  TAKE 1 TABLET BY MOUTH THREE TIMES DAILY AS NEEDED    [provider]  verapamil (CALAN-SR) 120 MG CR tablet verapamil ER (SR) 120 mg tablet,extended release    [provider]    Allergies    Amlodipine  Review of Systems   Review of Systems  Gastrointestinal: Positive for nausea.  All other systems reviewed and are negative.   Physical Exam Updated Vital Signs BP (!) 145/80 (BP Location: Right Arm)   Pulse (!) 58   Temp 98.3 F (36.8 C) (Oral)   Resp 18   Ht 5\' 4"  (1.626 m)   Wt 110.7 kg   SpO2 98%   BMI 41.88 kg/m   Physical Exam Vitals and nursing note reviewed.  Constitutional:      General: She is not in acute distress.    Appearance: She is well-developed and well-nourished. She is not ill-appearing.  HENT:     Head: Normocephalic and atraumatic.  Eyes:     Conjunctiva/sclera: Conjunctivae normal.  Cardiovascular:     Rate and Rhythm: Normal rate and regular rhythm.  Pulmonary:     Effort: Pulmonary effort is normal. No respiratory distress.     Breath sounds: Normal breath sounds.  Abdominal:     General: Bowel sounds are normal.     Palpations: Abdomen is soft.     Tenderness: There is no abdominal tenderness. There is no right CVA tenderness, left CVA tenderness, guarding or rebound.  Skin:    General: Skin is warm.  Neurological:     Mental Status: She is alert.   Psychiatric:        Mood and Affect: Mood and affect normal.        Behavior: Behavior normal.     ED Results / Procedures / Treatments   Labs (all labs ordered are listed, but only abnormal results are displayed) Labs Reviewed  CBC WITH DIFFERENTIAL/PLATELET - Abnormal; Notable for the following components:      Result Value   MCV 73.3 (*)    MCH 24.4 (*)    All other components within normal limits  BASIC METABOLIC PANEL - Abnormal; Notable for the following components:   BUN 32 (*)  Creatinine, Ser 1.54 (*)    GFR, Estimated 38 (*)    All other components within normal limits  BASIC METABOLIC PANEL - Abnormal; Notable for the following components:   BUN 29 (*)    Creatinine, Ser 1.33 (*)    GFR, Estimated 45 (*)    All other components within normal limits    EKG None  Radiology No results found.  Procedures Procedures   Medications Ordered in ED Medications  lip balm (BLISTEX) ointment (  Return to [facility] 11/04/20 1305)  sodium chloride 0.9 % bolus 1,000 mL (0 mLs Intravenous Stopped 11/04/20 1413)  lactated ringers bolus 1,000 mL ( Intravenous Stopped 11/04/20 1513)    ED Course  I have reviewed the triage vital signs and the nursing notes.  Pertinent labs & imaging results that were available during my care of the patient were reviewed by me and considered in my medical decision making (see chart for details).  Clinical Course as of 11/04/20 1641  Wed Nov 04, 2020  1354 She had some pedialyte and water after labs were drawn yesterday evening. [JR]    Clinical Course User Index [JR] Robinson, [place] N, PA-C   MDM Rules/Calculators/A&P                          Patient presenting to the ED for abnormal kidney function found by her GI doctor yesterday.  She initially checked in the ED yesterday and had labs drawn, however left due to wait times.  Her creatinine yesterday was 1.93 with BUN of 35 and GFR of 29.  She states she drinks some fluids yesterday  evening after coming to the ED.  She presents back today for evaluation.  States she was fasting since the 16th of this month for religious regions.  She was only drinking about a bottle and a half of water per day and that is all she had throughout the day for p.o. intake.  Today she is in no acute distress.  Repeat metabolic panel today with creatinine of 1.54, BUN 32 and GFR of 38. She is hydrated with 2 L of IV fluids and repeat metabolic panel is much improved overall since yesterday to creatinine of 1.3, BUN of 29 and GFR 45.     Patients AKI is attributed to dehydration due to fasting. She had AKI yesterday and near resolution today after IV fluids here in the ED. She is tolerating PO fluids and encouraged to continue to hydrate at home. She is well-appearing and in no distress. VS remain stable. She seems very reliable for follow up with PCP tomorrow for recheck. Patient agrees with care plan and plan for discharge to home.   Discussed results, findings, treatment and follow up. Patient advised of return precautions. Patient verbalized understanding and agreed with plan.  Final Clinical Impression(s) / ED Diagnoses Final diagnoses:  AKI (acute kidney injury) (HCC)  Dehydration    Rx / DC Orders ED Discharge Orders    None       Robinson, [place] N, PA-C 11/04/20 1641    Melene Plan, DO 11/05/20 5400

## 2020-11-04 NOTE — ED Notes (Signed)
Pt states that she does not want repeat lab work at this time, wants to wait to speak with Dr today.

## 2020-11-04 NOTE — ED Triage Notes (Signed)
Pt arrives to ED with reports of abnormal lab and being sent by her MD for IV fluids. Pt was in triage yesterday but left prior to being seen r/t long wait times. Pt reports she went home and attempted to orally hydrate.

## 2020-11-04 NOTE — Discharge Instructions (Addendum)
It is very important you continue to hydrate.  Follow closely with your outpatient provider for recheck of your kidney function tomorrow, and at the latest by Friday.

## 2020-11-04 NOTE — ED Notes (Signed)
Pt discharged to home. Discharge instructions have been discussed with patient and/or family members. Pt verbally acknowledges understanding d/c instructions, and endorses comprehension to checkout at registration before leaving.  °

## 2020-11-04 NOTE — ED Notes (Signed)
BMP collected as per verbal from EDP [place]

## 2020-11-09 ENCOUNTER — Ambulatory Visit (INDEPENDENT_AMBULATORY_CARE_PROVIDER_SITE_OTHER): Payer: Self-pay | Admitting: Podiatry

## 2020-11-09 ENCOUNTER — Other Ambulatory Visit: Payer: Self-pay

## 2020-11-09 DIAGNOSIS — M2141 Flat foot [pes planus] (acquired), right foot: Secondary | ICD-10-CM

## 2020-11-09 DIAGNOSIS — G8929 Other chronic pain: Secondary | ICD-10-CM

## 2020-11-09 DIAGNOSIS — M2142 Flat foot [pes planus] (acquired), left foot: Secondary | ICD-10-CM

## 2020-11-09 DIAGNOSIS — M25571 Pain in right ankle and joints of right foot: Secondary | ICD-10-CM

## 2020-11-09 DIAGNOSIS — M779 Enthesopathy, unspecified: Secondary | ICD-10-CM

## 2020-11-09 NOTE — Progress Notes (Signed)
Subjective: 65 year old female presents the office today for follow-up evaluation of right ankle tendinitis.  She states that overall she is feeling better.  She does wear compression socks which is been helpful as well.  She had to cancel her last several physical therapy due to exposure to COVID but she feels that physical therapy was helpful and she wants to get back to this.  She is also wearing orthotics but she has not changed her shoes yet. Denies any systemic complaints such as fevers, chills, nausea, vomiting. No acute changes since last appointment, and no other complaints at this time.   Objective: AAO x3, NAD DP/PT pulses palpable bilaterally, CRT less than 3 seconds Is unremarkable identify any area pinpoint tenderness.  Particular there is no discomfort on the forefoot proximal, extensor tendons on the peroneal tendon or the Achilles tendon.  There is some mild chronic edema present there is no erythema or warmth.  MMT 5/5.  Negative Tinel sign. No pain with calf compression, swelling, warmth, erythema  Assessment: Tendinitis  Plan: -All treatment options discussed with the patient including all alternatives, risks, complications.  -Overall doing better this neck pain.  I do not get back to physical therapy and she is can make appointments for this to follow back up.  Continue orthotics and discussion modifications.  At this point is not having any pain to me see her back as needed or when she finishes physical therapy. -Patient encouraged to call the office with any questions, concerns, change in symptoms.   Vivi Barrack DPM

## 2020-11-17 ENCOUNTER — Other Ambulatory Visit: Payer: Self-pay | Admitting: Physician Assistant

## 2020-11-17 ENCOUNTER — Ambulatory Visit
Admission: RE | Admit: 2020-11-17 | Discharge: 2020-11-17 | Disposition: A | Discharge status: Home / Self Care | Payer: BC Managed Care – PPO | Source: Ambulatory Visit | Attending: Physician Assistant | Admitting: Physician Assistant

## 2020-11-17 DIAGNOSIS — R1032 Left lower quadrant pain: Secondary | ICD-10-CM

## 2020-12-25 ENCOUNTER — Other Ambulatory Visit: Payer: Self-pay

## 2020-12-25 DIAGNOSIS — Z1231 Encounter for screening mammogram for malignant neoplasm of breast: Secondary | ICD-10-CM

## 2021-02-17 ENCOUNTER — Ambulatory Visit
Admission: RE | Admit: 2021-02-17 | Discharge: 2021-02-17 | Disposition: A | Discharge status: Home / Self Care | Payer: BC Managed Care – PPO | Source: Ambulatory Visit | Attending: Family Medicine | Admitting: Family Medicine

## 2021-02-17 ENCOUNTER — Other Ambulatory Visit: Payer: Self-pay

## 2021-02-17 DIAGNOSIS — Z1231 Encounter for screening mammogram for malignant neoplasm of breast: Secondary | ICD-10-CM

## 2021-02-18 ENCOUNTER — Other Ambulatory Visit: Payer: Self-pay | Admitting: Family Medicine

## 2021-02-18 DIAGNOSIS — R928 Other abnormal and inconclusive findings on diagnostic imaging of breast: Secondary | ICD-10-CM

## 2021-03-11 ENCOUNTER — Other Ambulatory Visit: Payer: Self-pay

## 2021-03-11 ENCOUNTER — Ambulatory Visit
Admission: RE | Admit: 2021-03-11 | Discharge: 2021-03-11 | Disposition: A | Discharge status: Home / Self Care | Payer: BC Managed Care – PPO | Source: Ambulatory Visit | Attending: Family Medicine | Admitting: Family Medicine

## 2021-03-11 DIAGNOSIS — R928 Other abnormal and inconclusive findings on diagnostic imaging of breast: Secondary | ICD-10-CM

## 2021-09-11 ENCOUNTER — Emergency Department (HOSPITAL_COMMUNITY): Payer: BC Managed Care – PPO

## 2021-09-11 ENCOUNTER — Emergency Department (HOSPITAL_COMMUNITY)
Admission: EM | Admit: 2021-09-11 | Discharge: 2021-09-11 | Disposition: A | Discharge status: Home / Self Care | Payer: BC Managed Care – PPO | Attending: Emergency Medicine | Admitting: Emergency Medicine

## 2021-09-11 ENCOUNTER — Encounter (HOSPITAL_COMMUNITY): Payer: Self-pay

## 2021-09-11 ENCOUNTER — Other Ambulatory Visit: Payer: Self-pay

## 2021-09-11 DIAGNOSIS — K5792 Diverticulitis of intestine, part unspecified, without perforation or abscess without bleeding: Secondary | ICD-10-CM

## 2021-09-11 DIAGNOSIS — K5732 Diverticulitis of large intestine without perforation or abscess without bleeding: Secondary | ICD-10-CM | POA: Diagnosis not present

## 2021-09-11 DIAGNOSIS — I10 Essential (primary) hypertension: Secondary | ICD-10-CM | POA: Diagnosis not present

## 2021-09-11 DIAGNOSIS — Z79899 Other long term (current) drug therapy: Secondary | ICD-10-CM | POA: Diagnosis not present

## 2021-09-11 DIAGNOSIS — K921 Melena: Secondary | ICD-10-CM | POA: Diagnosis present

## 2021-09-11 LAB — COMPREHENSIVE METABOLIC PANEL
ALT: 16 U/L (ref 0–44)
AST: 19 U/L (ref 15–41)
Albumin: 4.3 g/dL (ref 3.5–5.0)
Alkaline Phosphatase: 80 U/L (ref 38–126)
Anion gap: 9 (ref 5–15)
BUN: 23 mg/dL (ref 8–23)
CO2: 26 mmol/L (ref 22–32)
Calcium: 9.7 mg/dL (ref 8.9–10.3)
Chloride: 101 mmol/L (ref 98–111)
Creatinine, Ser: 1.03 mg/dL — ABNORMAL HIGH (ref 0.44–1.00)
GFR, Estimated: >60 mL/min (ref >60)
Glucose, Bld: 117 mg/dL — ABNORMAL HIGH (ref 70–99)
Potassium: 3.4 mmol/L — ABNORMAL LOW (ref 3.5–5.1)
Sodium: 136 mmol/L (ref 135–145)
Total Bilirubin: 1.1 mg/dL (ref 0.3–1.2)
Total Protein: 8.4 g/dL — ABNORMAL HIGH (ref 6.5–8.1)

## 2021-09-11 LAB — ABO/RH: ABO/RH(D): O POS

## 2021-09-11 LAB — TYPE AND SCREEN
ABO/RH(D): O POS
Antibody Screen: NEGATIVE

## 2021-09-11 LAB — CBC
HCT: 38 % (ref 36.0–46.0)
Hemoglobin: 12.7 g/dL (ref 12.0–15.0)
MCH: 24.6 pg — ABNORMAL LOW (ref 26.0–34.0)
MCHC: 33.4 g/dL (ref 30.0–36.0)
MCV: 73.5 fL — ABNORMAL LOW (ref 80.0–100.0)
Platelets: 276 10*3/uL (ref 150–400)
RBC: 5.17 MIL/uL — ABNORMAL HIGH (ref 3.87–5.11)
RDW: 14.5 % (ref 11.5–15.5)
WBC: 6.5 10*3/uL (ref 4.0–10.5)
nRBC: 0 % (ref 0.0–0.2)

## 2021-09-11 LAB — POC OCCULT BLOOD, ED: Fecal Occult Bld: POSITIVE — AB

## 2021-09-11 LAB — HEMOGLOBIN AND HEMATOCRIT, BLOOD
HCT: 38.3 % (ref 36.0–46.0)
Hemoglobin: 12.7 g/dL (ref 12.0–15.0)

## 2021-09-11 MED ORDER — AMOXICILLIN-POT CLAVULANATE 875-125 MG PO TABS: 1.0000 | ORAL_TABLET | Freq: Two times a day (BID) | ORAL | 0 refills | Status: DC

## 2021-09-11 MED ORDER — IOHEXOL 350 MG/ML SOLN
100.0000 mL | Freq: Once | INTRAVENOUS | Status: AC | PRN
  Administered 2021-09-11: 100 mL via INTRAVENOUS

## 2021-09-11 NOTE — ED Triage Notes (Signed)
Patient said she had high BP the last few days even though she was taking her medications. Patient talked to her doctor they thought she had vertigo. When she went to see them her BP was great, then she checked it at home and said it was high again. Yesterday she went to the bathroom and when she had a bowel movement, she had blood in it. It was dark red. She thought it maybe was something she ate. She has only seen the blood in her stool one time.

## 2021-09-11 NOTE — Discharge Instructions (Addendum)
Your blood work today was reassuring.  He had multiple hemoglobins checked which were stable.  Your stool was also checked for microscopic bleeding which was positive.  CT scan showed potential diverticulitis.  I will send in antibiotics to the pharmacy to treat diverticulitis.  Even though your stool sample was positive for microscopic bleeding your hemoglobins were stable reassuring that you do not have a major bleed at this time.  If you develop worsening episodes of rebleeding, or become lightheaded please return to the emergency room for evaluation.  Otherwise I recommend you call your gastroenterologist Monday to schedule a follow-up appointment.  I would also like for you to follow-up with your PCP and you can also keep a blood pressure diary in the meantime to have reevaluated for your elevated blood pressure readings.

## 2021-09-11 NOTE — ED Provider Notes (Signed)
[facility]-EMERGENCY DEPT Provider Note   CSN: [id number] Arrival date & time: 09/11/21  0216     History Chief Complaint  Patient presents with   Bloated   Blood In Stools    Veronica Schmidt is a 65 y.o. female.  65 year old female presents today for evaluation of hematochezia x1 episode yesterday.  She denies abdominal pain, nausea, vomiting, lightheadedness.  She reports she has not had another bowel movement since that episode.  She reports she did have a colonoscopy done earlier this year which showed diverticulosis without other findings.  She reports several days ago she had elevated blood pressure along with dizziness and she was evaluated at her primary care office with normal blood pressure.  She reports her dizziness has resolved since.  She has not taken her blood pressure medications this morning.  She states she takes 3 blood pressure medications.  She denies any shortness of breath, chest pain, visual change, or current lightheadedness.  Prior to her episode of hematochezia she denies eating anything with red or beets.  She otherwise states her bowel movements prior to this episode were brown.  She does not take an iron supplement.  The history is provided by the patient. No language interpreter was used.      Past Medical History:  Diagnosis Date   Anemia    Hypertension    Obesity    Snores    Wears glasses     Patient Active Problem List   Diagnosis Date Noted   Degeneration of lumbar intervertebral disc 01/16/2018   Lumbar radiculopathy 01/16/2018   Pain in right knee 01/03/2018   Low back pain 11/14/2017   Trochanteric bursitis of right hip 11/14/2017   Epidermal inclusion cyst 04/24/2013    Past Surgical History:  Procedure Laterality Date   BREAST SURGERY     br bx-lt   COLONOSCOPY     CYST REMOVAL TRUNK Left 05/09/2013   Procedure: EXCISION OF UPPER BACK SEBACEOUS CYST REMOVAL ;  Surgeon: Atilano Ina, MD;  Location: [facility];  Service: General;  Laterality: Left;   TOTAL ABDOMINAL HYSTERECTOMY  01/21/2002     OB History   No obstetric history on file.     History reviewed. No pertinent family history.  Social History   Tobacco Use   Smoking status: Never   Smokeless tobacco: Never  Substance Use Topics   Alcohol use: No   Drug use: No    Home Medications Prior to Admission medications   Medication Sig Start Date End Date Taking? Authorizing Provider  amLODipine (NORVASC) 10 MG tablet amlodipine    [provider]  amLODIPine Besylate (NORVASC PO) amlodipine    [provider]  diclofenac (CATAFLAM) 50 MG tablet diclofenac potassium 50 mg tablet    [provider]  hydrochlorothiazide (HYDRODIURIL) 25 MG tablet Take 25 mg by mouth daily. 07/29/20   [provider]  hydrochlorothiazide (MICROZIDE) 12.5 MG capsule hydrochlorothiazide    [provider]  ibuprofen (ADVIL,MOTRIN) 400 MG tablet Take 1 tablet (400 mg total) by mouth every 6 (six) hours as needed. 09/27/16   Mesner, Barbara Cower, MD  losartan (COZAAR) 100 MG tablet Take 100 mg by mouth daily. 07/29/20   [provider]  losartan-hydrochlorothiazide (HYZAAR) 100-25 MG per tablet Take 1 tablet by mouth daily.  02/01/13   [provider]  losartan-hydrochlorothiazide (HYZAAR) 100-25 MG tablet Take 1 tablet by mouth daily.    [provider]  methylPREDNISolone (MEDROL DOSEPAK) 4 MG TBPK tablet Take as directed 08/10/20   Trula Slade, DPM  metoprolol tartrate (LOPRESSOR) 25 MG tablet Take 25 mg by mouth 2 (two) times daily.    [provider]  metoprolol tartrate (LOPRESSOR) 25 MG tablet Take 25 mg by mouth 2 (two) times daily.    [provider]  Multiple Vitamin (MULTIVITAMIN WITH MINERALS) TABS tablet Take 1 tablet by mouth daily.    [provider]  Multiple Vitamins-Minerals (MULTIVITAMIN WITH MINERALS) tablet Take 1 tablet by  mouth daily.    [provider]  oxyCODONE-acetaminophen (ROXICET) 5-325 MG per tablet Take 1-2 tablets by mouth every 4 (four) hours as needed for pain. Patient not taking: No sig reported 05/09/13   Greer Pickerel, MD  predniSONE (DELTASONE) 10 MG tablet prednisone 10 mg tablet  Take 6 tablets po x1 day, 5 tablets po x 1 day, 4 tablets po x 1 day, 3 tablets po x 1 day, 2 tablets po x 1 day, 1 tablet po x 1 day    [provider]  traMADol (ULTRAM) 50 MG tablet tramadol 50 mg tablet  TAKE 1 TABLET BY MOUTH THREE TIMES DAILY AS NEEDED    [provider]  verapamil (CALAN-SR) 120 MG CR tablet verapamil ER (SR) 120 mg tablet,extended release    [provider]  verapamil (VERELAN PM) 240 MG 24 hr capsule Take 240 mg by mouth at bedtime.    [provider]    Allergies    Amlodipine  Review of Systems   Review of Systems  Constitutional:  Negative for chills and fever.  Eyes:  Negative for visual disturbance.  Respiratory:  Negative for shortness of breath.   Cardiovascular:  Negative for chest pain.  Gastrointestinal:  Positive for blood in stool. Negative for abdominal pain, constipation, nausea and vomiting.  Musculoskeletal:  Negative for back pain.  Neurological:  Negative for syncope and light-headedness.  All other systems reviewed and are negative.  Physical Exam Updated Vital Signs BP (!) 157/100 (BP Location: Left Arm)   Pulse 67   Temp 97.9 F (36.6 C) (Oral)   Resp 16   Ht 5\' 4"  (1.626 m)   Wt 113.4 kg   SpO2 97%   BMI 42.91 kg/m   Physical Exam  ED Results / Procedures / Treatments   Labs (all labs ordered are listed, but only abnormal results are displayed) Labs Reviewed  COMPREHENSIVE METABOLIC PANEL - Abnormal; Notable for the following components:      Result Value   Potassium 3.4 (*)    Glucose, Bld 117 (*)    Creatinine, Ser 1.03 (*)    Total Protein 8.4 (*)    All other components within normal limits  CBC -  Abnormal; Notable for the following components:   RBC 5.17 (*)    MCV 73.5 (*)    MCH 24.6 (*)    All other components within normal limits  HEMOGLOBIN AND HEMATOCRIT, BLOOD  POC OCCULT BLOOD, ED  TYPE AND SCREEN  ABO/RH    EKG None  Radiology No results found.  Procedures Procedures   Medications Ordered in ED Medications - No data to display  ED Course  I have reviewed the triage vital signs and the nursing notes.  Pertinent labs & imaging results that were available during my care of the patient were reviewed by me and considered in my medical decision making (see chart for details).    MDM Rules/Calculators/A&P  65 year old female presents today for evaluation of blood in her stool x1 episode that occurred yesterday.  Patient underwent GI work-up with screening colonoscopy beginning of this year significant only for diverticulosis.  CT done today is significant for diverticulosis with possible diverticulitis.  Without other acute intra-abdominal process.  We will start patient on Augmentin.  Given patient has had multiple hemoglobins drawn over her stay here both of which were 12.7 and stable.  She is without signs or symptoms acute GI bleed.  Patient did have positive Hemoccult.  Patient is already established with GI.  Patient will follow-up with GI for further work-up and management.  Patient is appropriate for discharge.  Patient has remained stable in the emergency room without fever, or leukocytosis on CBC.  Patient is also concerned about her elevated blood pressure.  She was seen at her PCP office for the same reason few days ago but had a normotensive reading.  I encouraged her to keep a blood pressure diary and follow-up with her PCP.  Return precautions discussed with patient at length.  Patient voices understanding and is in agreement with the plan.  Final Clinical Impression(s) / ED Diagnoses Final diagnoses:  None    Rx / DC  Orders ED Discharge Orders     None        Evlyn Courier, PA-C 09/11/21 1526    Fredia Sorrow, MD 09/16/21 [phone]

## 2021-12-10 IMAGING — US US BREAST*L* LIMITED INC AXILLA
1 series · 6 of 6 positions shown · non-contrast
Comparison: Previous exam(s).

CLINICAL DATA: Recall from screening mammography, possible mass
involving the OUTER LEFT breast at POSTERIOR depth.

EXAM:
DIGITAL DIAGNOSTIC UNILATERAL LEFT MAMMOGRAM WITH TOMOSYNTHESIS AND
CAD; ULTRASOUND LEFT BREAST LIMITED
TECHNIQUE: Left digital diagnostic mammography and breast tomosynthesis was
performed. The images were evaluated with computer-aided detection.;
Targeted ultrasound examination of the left breast was performed

[Series 1: us breast*left* limited inc axilla · 0.06mm/px · 6 of 6 slices shown]
[im 1/6]
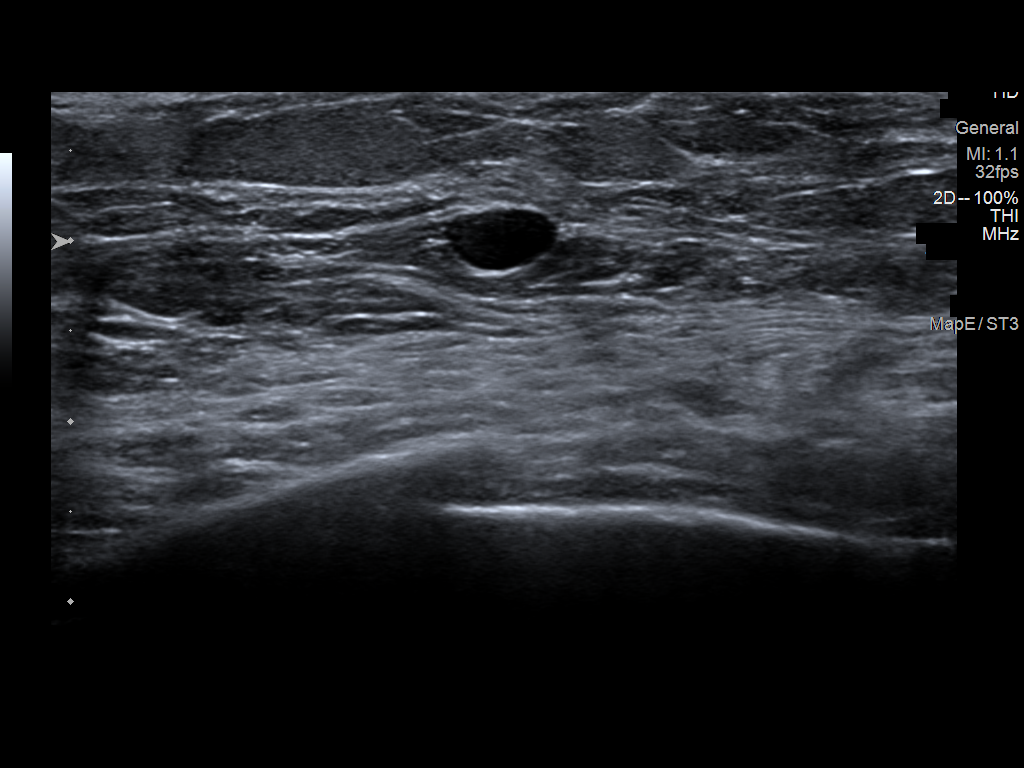
[im 2/6]
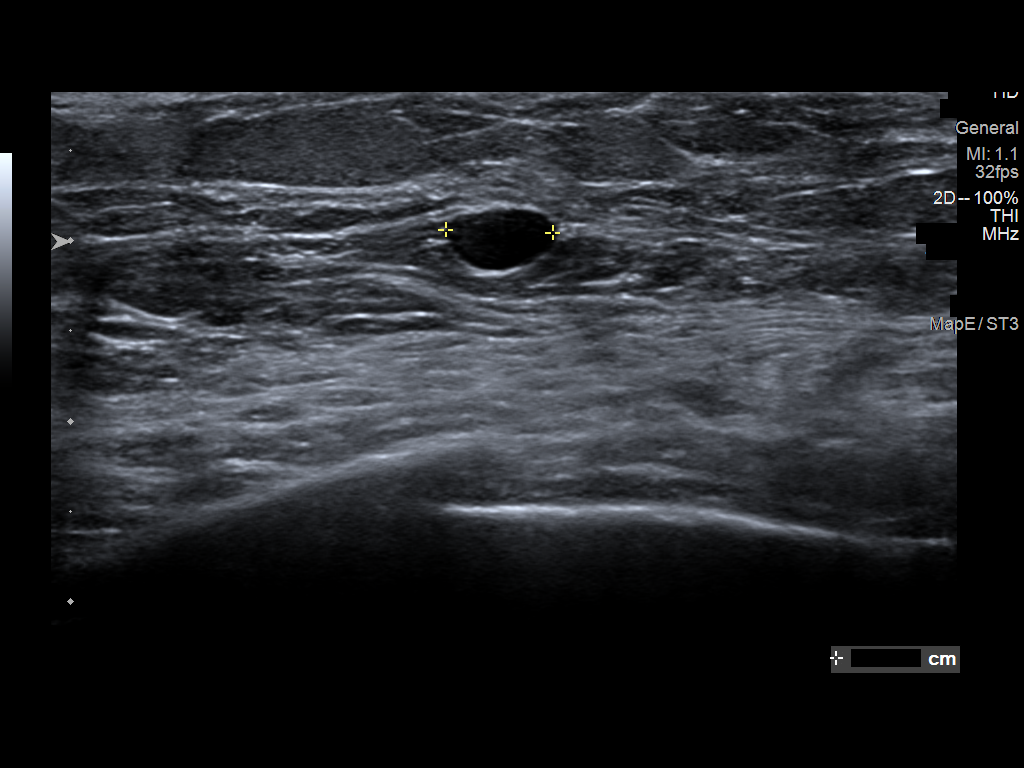
[im 3/6]
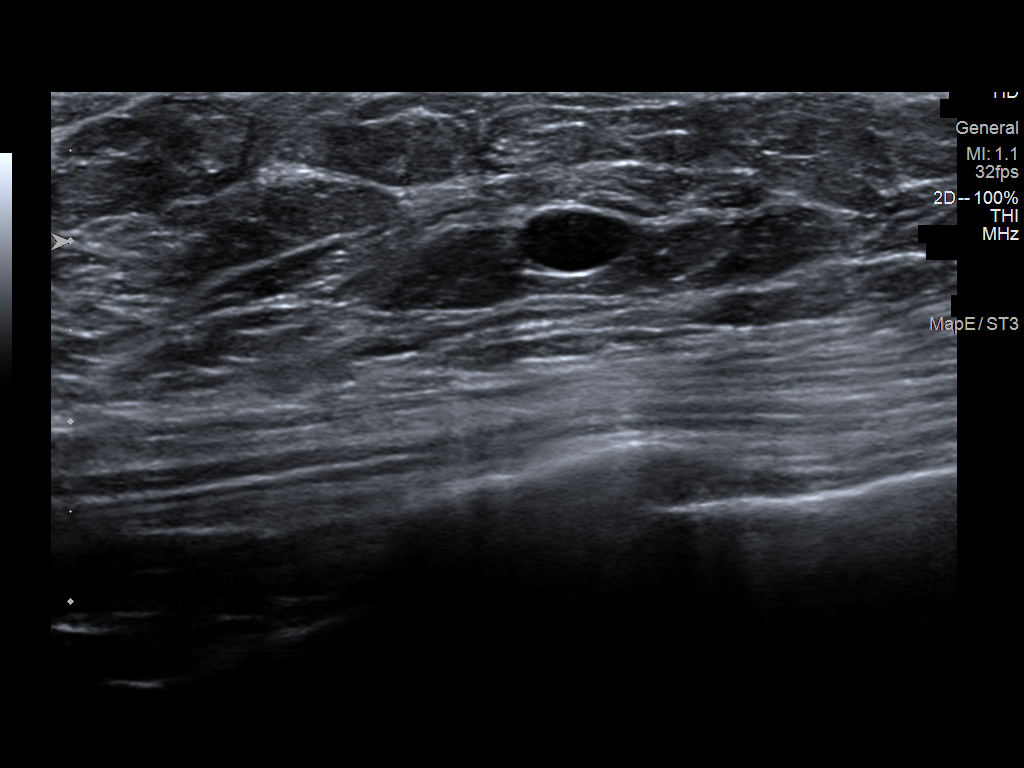
[im 4/6]
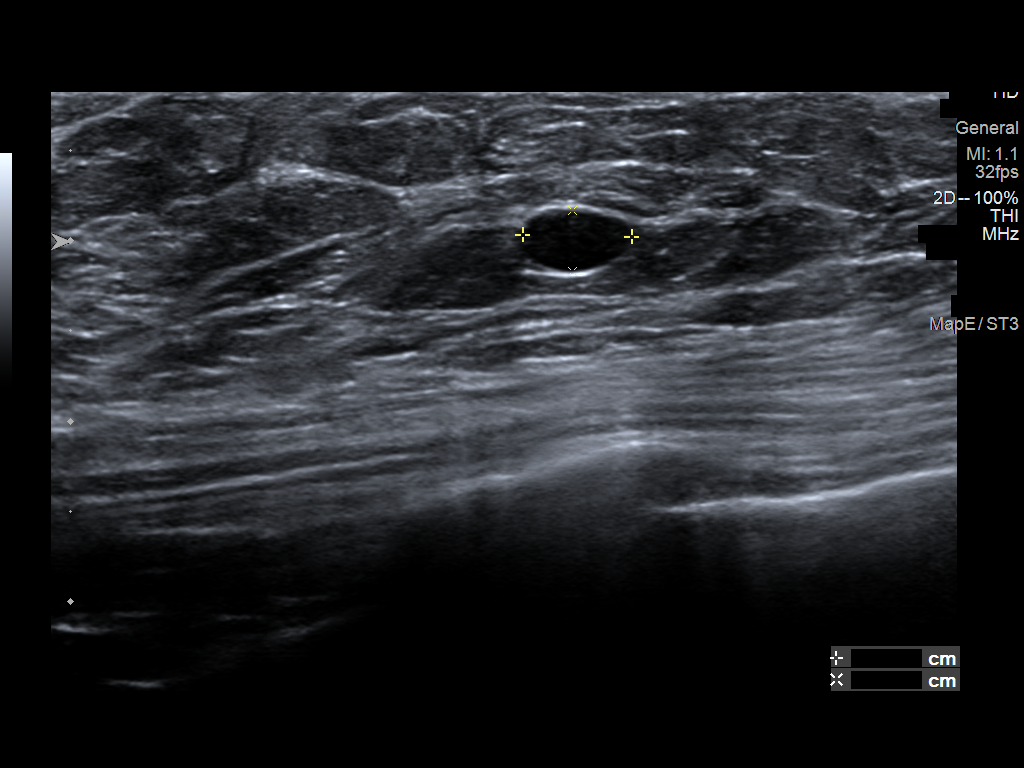
[im 5/6]
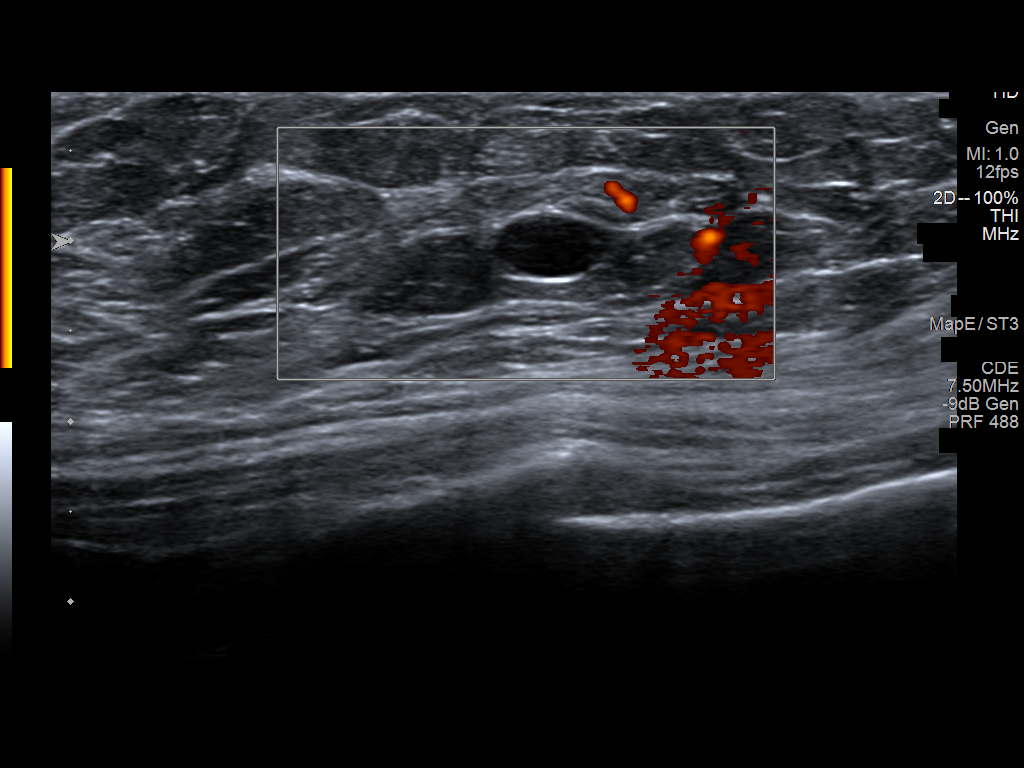
[im 6/6]
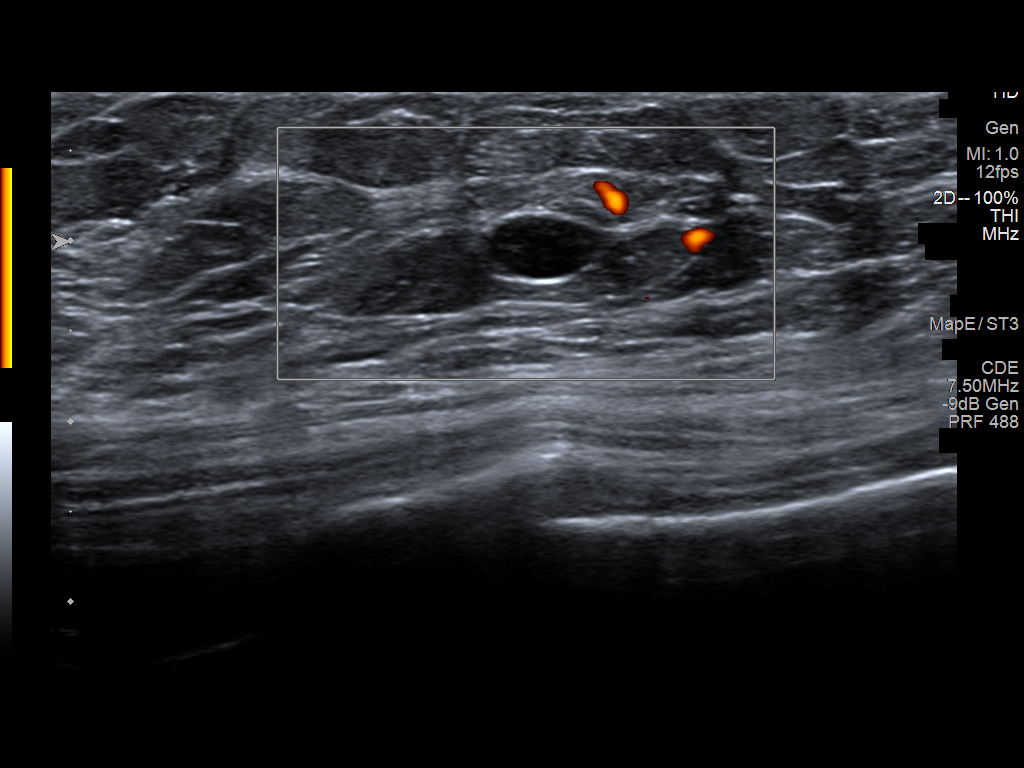

[6 of 6 positions shown; findings below may reference images not displayed]

ACR Breast Density Category b: There are scattered areas of
fibroglandular density.
FINDINGS: Spot-compression CC and MLO views of the area of concern in the
outer breast at posterior depth were obtained.

These confirm a circumscribed isodense mass measuring approximately
7 mm without associated architectural distortion or suspicious
calcifications.

Targeted ultrasound is performed, demonstrating an oval
circumscribed parallel nearly anechoic mass with scattered internal
echoes at the 3 o'clock position approximately 15 cm from the nipple
at posterior depth measuring approximately 6 x 3 x 6 mm,
demonstrating posterior acoustic enhancement and no internal power
Doppler flow, corresponding to the screening mammographic finding.
No suspicious solid mass or abnormal acoustic shadowing is
identified.
IMPRESSION: 1. No mammographic or sonographic evidence of malignancy involving
the LEFT breast.
2. Benign 6 mm cyst in the outer LEFT breast at the 3 o'clock
position 15 cm from the nipple which accounts for the screening
mammographic finding.

RECOMMENDATION:
Screening mammogram in one year.(Code:55-M-M63)

I have discussed the findings and recommendations with the patient.
If applicable, a reminder letter will be sent to the patient
regarding the next appointment.

BI-RADS CATEGORY  2: Benign.

## 2022-03-18 DIAGNOSIS — M25512 Pain in left shoulder: Secondary | ICD-10-CM | POA: Diagnosis not present

## 2022-04-06 DIAGNOSIS — I10 Essential (primary) hypertension: Secondary | ICD-10-CM | POA: Diagnosis not present

## 2022-04-06 DIAGNOSIS — R109 Unspecified abdominal pain: Secondary | ICD-10-CM | POA: Diagnosis not present

## 2022-04-26 ENCOUNTER — Other Ambulatory Visit: Payer: Self-pay | Admitting: Family Medicine

## 2022-04-26 ENCOUNTER — Ambulatory Visit
Admission: RE | Admit: 2022-04-26 | Discharge: 2022-04-26 | Disposition: A | Discharge status: Home / Self Care | Payer: Medicare PPO | Source: Ambulatory Visit | Attending: Family Medicine | Admitting: Family Medicine

## 2022-04-26 DIAGNOSIS — R102 Pelvic and perineal pain: Secondary | ICD-10-CM

## 2022-04-26 DIAGNOSIS — M778 Other enthesopathies, not elsewhere classified: Secondary | ICD-10-CM | POA: Diagnosis not present

## 2022-04-26 DIAGNOSIS — M25552 Pain in left hip: Secondary | ICD-10-CM | POA: Diagnosis not present

## 2022-04-26 DIAGNOSIS — M1612 Unilateral primary osteoarthritis, left hip: Secondary | ICD-10-CM | POA: Diagnosis not present

## 2022-05-04 DIAGNOSIS — M25511 Pain in right shoulder: Secondary | ICD-10-CM | POA: Diagnosis not present

## 2022-05-04 DIAGNOSIS — M25552 Pain in left hip: Secondary | ICD-10-CM | POA: Diagnosis not present

## 2022-05-19 DIAGNOSIS — H25013 Cortical age-related cataract, bilateral: Secondary | ICD-10-CM | POA: Diagnosis not present

## 2022-05-19 DIAGNOSIS — H52203 Unspecified astigmatism, bilateral: Secondary | ICD-10-CM | POA: Diagnosis not present

## 2022-06-12 IMAGING — CT CT ABD-PELV W/ CM
2 of 5 series · 17 of 46 positions shown, 19 images · IV contrast (omnipaque)
Comparison: November 17, 2020

CLINICAL DATA: Nonlocalized abdominal pain, dark red stool for 1
day.

EXAM:
CT ABDOMEN AND PELVIS WITH CONTRAST
TECHNIQUE: Multidetector CT imaging of the abdomen and pelvis was performed
using the standard protocol following bolus administration of
intravenous contrast.
CONTRAST:  100mL OMNIPAQUE IOHEXOL 350 MG/ML SOLN

[Series 2: axial st · axial · 0.79mm/px · z∈[-825,-445]mm · 14 of 88 slices shown, 16 images]
[im 6/88  soft-tissue]
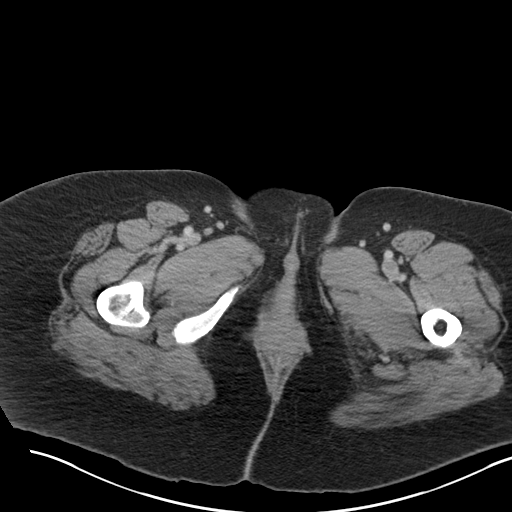
[im 6/88  bone]
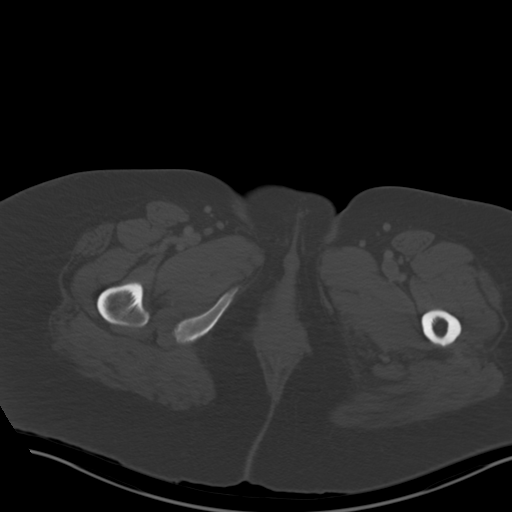
[im 11/88  soft-tissue]
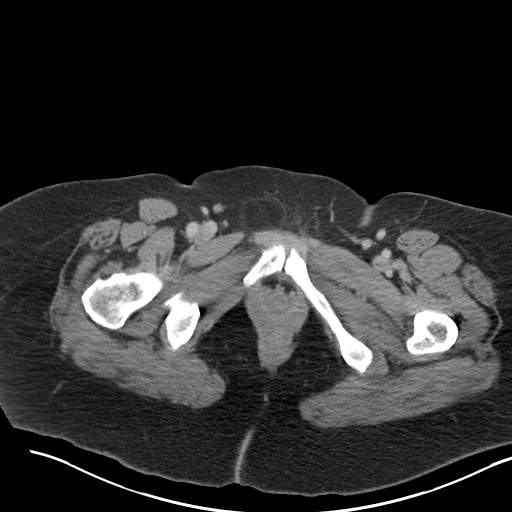
[im 17/88  soft-tissue]
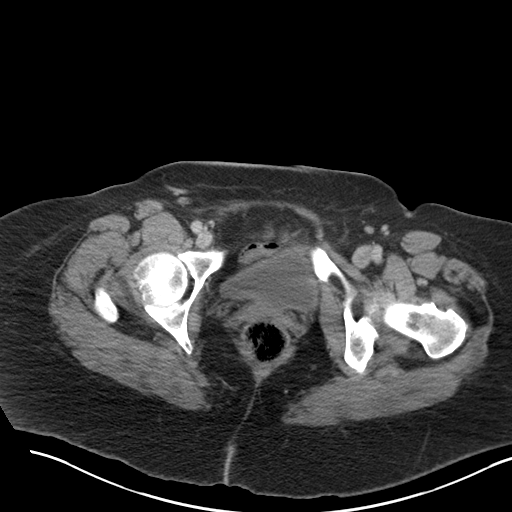
[im 22/88  soft-tissue]
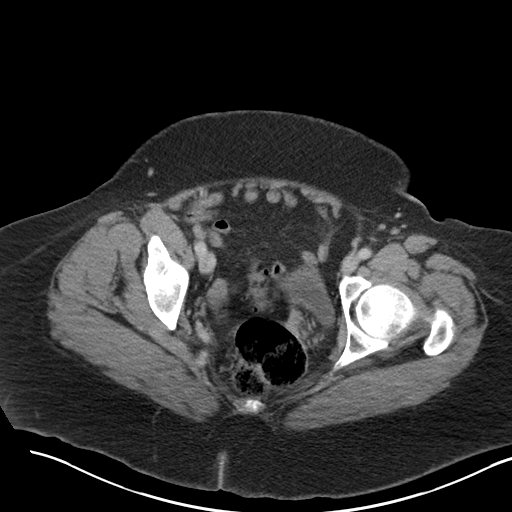
[im 28/88  soft-tissue]
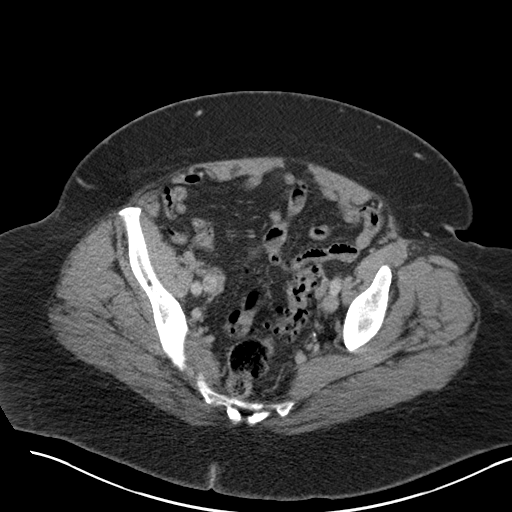
[im 33/88  soft-tissue]
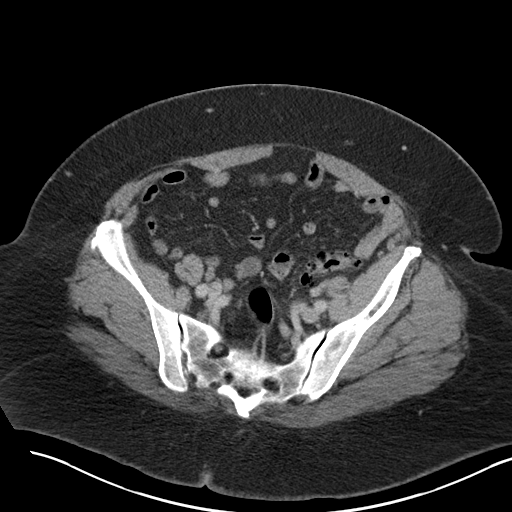
[im 39/88  soft-tissue]
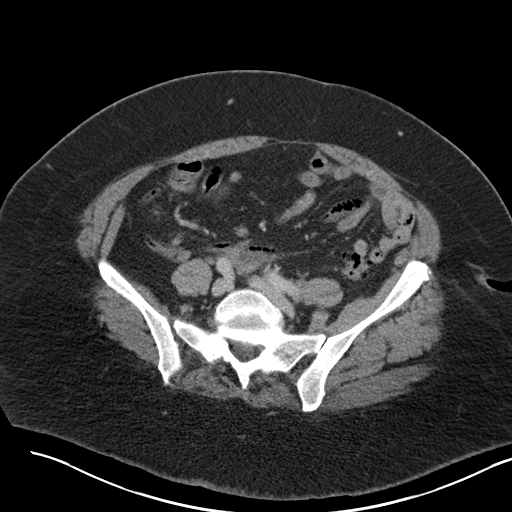
[im 49/88  soft-tissue]
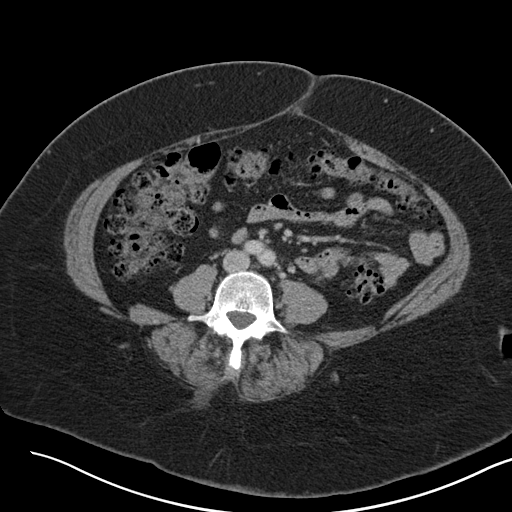
[im 55/88  soft-tissue]
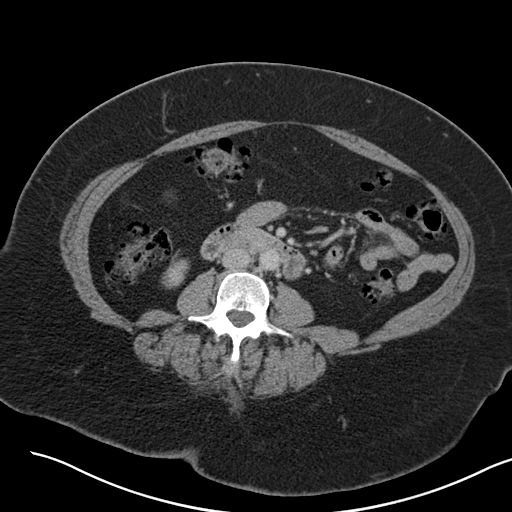
[im 55/88  bone]
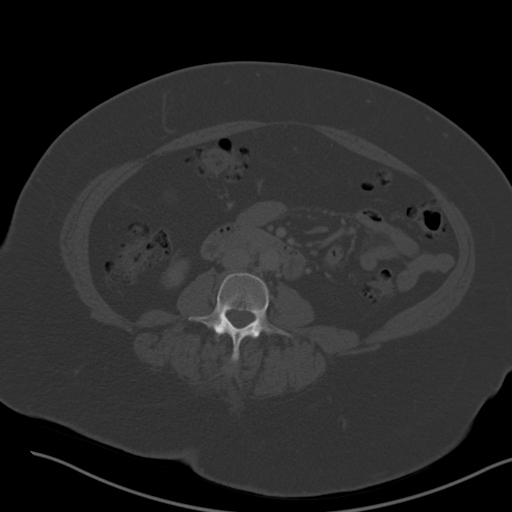
[im 60/88  soft-tissue]
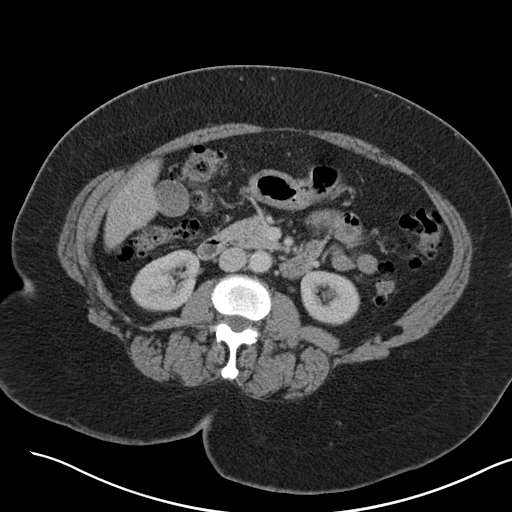
[im 66/88  soft-tissue]
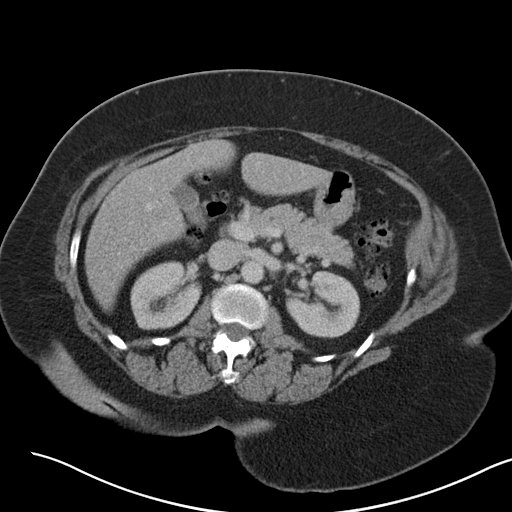
[im 71/88  soft-tissue]
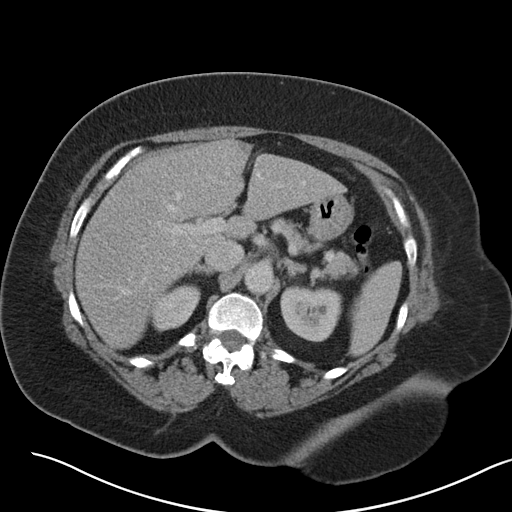
[im 77/88  soft-tissue]
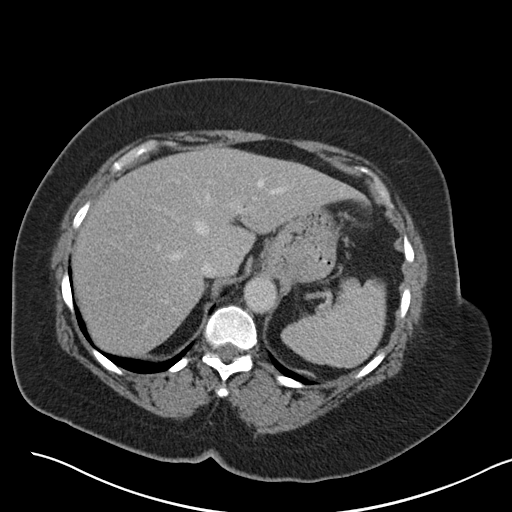
[im 82/88  soft-tissue]
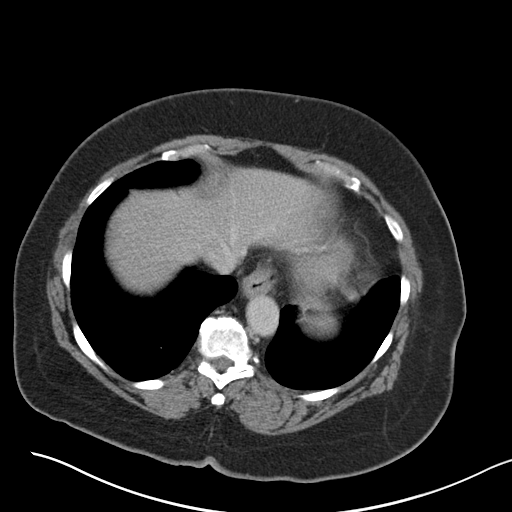

[Series 4: coronal st · coronal · 0.83mm/px · 3 of 165 slices shown]
[im 55/165  soft-tissue]
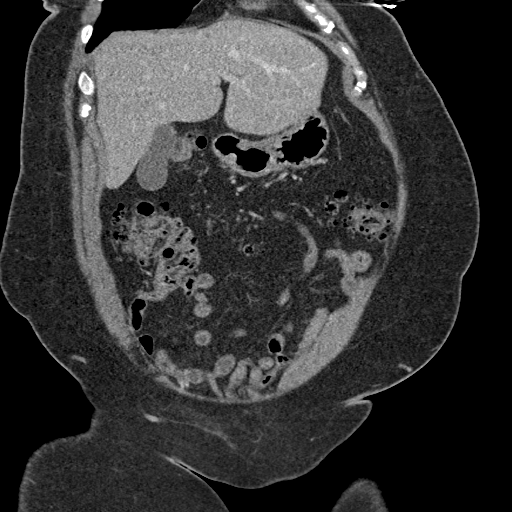
[im 73/165  soft-tissue]
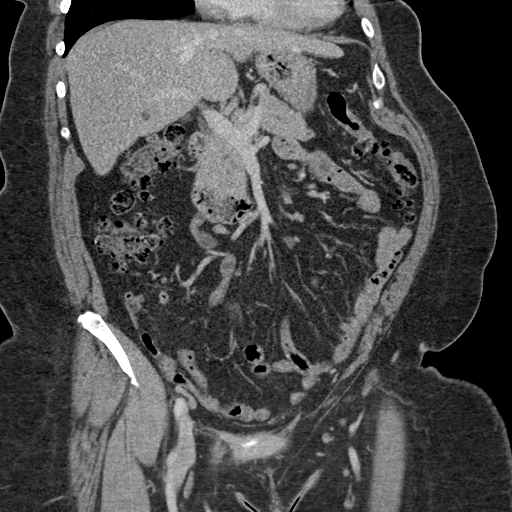
[im 92/165  soft-tissue]
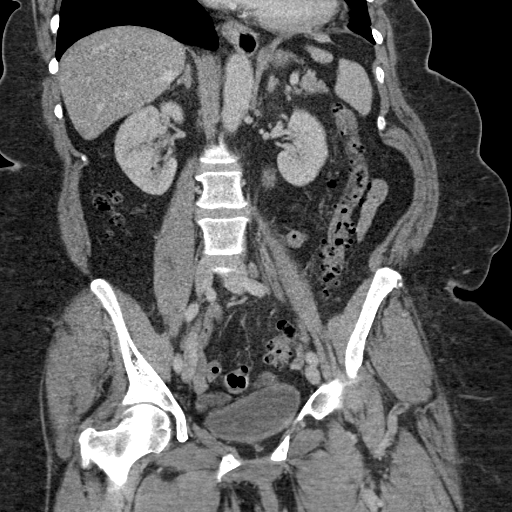

[17 of 46 positions shown; findings below may reference images not displayed]

FINDINGS: Lower chest: No acute abnormality.

Hepatobiliary: No focal liver abnormality is seen. No gallstones,
gallbladder wall thickening, or biliary dilatation.

Pancreas: Unremarkable. No pancreatic ductal dilatation or
surrounding inflammatory changes.

Spleen: Normal in size without focal abnormality.

Adrenals/Urinary Tract: Adrenal glands are unremarkable. Kidneys are
normal, without renal calculi, focal lesion, or hydronephrosis.
Bladder is unremarkable.

Stomach/Bowel: Stomach is within normal limits. Appendix appears
normal. No evidence of small bowel wall thickening, distention, or
inflammatory changes. Marked diffuse colonic diverticulosis.
Segments of mild mucosal thickening of the colon in the ascending,
transverse and descending colon may represent elements of the
radiculitis. No evidence of abscess formation or frank pericolonic
inflammatory changes.

Vascular/Lymphatic: No significant vascular findings are present. No
enlarged abdominal or pelvic lymph nodes.

Reproductive: Status post hysterectomy. No adnexal masses.

Other: No abdominal wall hernia or abnormality. No abdominopelvic
ascites.

Musculoskeletal: No acute or significant osseous findings.
IMPRESSION: Marked diffuse colonic diverticulosis. Segments of mild mucosal
thickening in the ascending, transverse and descending colon may
represent elements of diverticulitis. No evidence of abscess
formation or frank pericolonic inflammatory changes.

## 2022-08-03 DIAGNOSIS — R1011 Right upper quadrant pain: Secondary | ICD-10-CM | POA: Diagnosis not present

## 2022-10-14 ENCOUNTER — Other Ambulatory Visit: Payer: Self-pay | Admitting: Family Medicine

## 2022-10-14 DIAGNOSIS — R079 Chest pain, unspecified: Secondary | ICD-10-CM | POA: Diagnosis not present

## 2022-10-14 DIAGNOSIS — E2839 Other primary ovarian failure: Secondary | ICD-10-CM

## 2022-10-14 DIAGNOSIS — Z0000 Encounter for general adult medical examination without abnormal findings: Secondary | ICD-10-CM | POA: Diagnosis not present

## 2022-10-14 DIAGNOSIS — Z1231 Encounter for screening mammogram for malignant neoplasm of breast: Secondary | ICD-10-CM

## 2022-10-14 DIAGNOSIS — Z23 Encounter for immunization: Secondary | ICD-10-CM | POA: Diagnosis not present

## 2022-10-14 DIAGNOSIS — R350 Frequency of micturition: Secondary | ICD-10-CM | POA: Diagnosis not present

## 2022-10-14 DIAGNOSIS — I10 Essential (primary) hypertension: Secondary | ICD-10-CM | POA: Diagnosis not present

## 2022-11-17 ENCOUNTER — Ambulatory Visit: Payer: Medicare PPO | Attending: Interventional Cardiology | Admitting: Interventional Cardiology

## 2022-11-17 ENCOUNTER — Encounter: Payer: Self-pay | Admitting: Interventional Cardiology

## 2022-11-17 VITALS — BP 170/110 | HR 67 | Ht 64.0 in | Wt 244.2 lb

## 2022-11-17 DIAGNOSIS — G4733 Obstructive sleep apnea (adult) (pediatric): Secondary | ICD-10-CM | POA: Diagnosis not present

## 2022-11-17 DIAGNOSIS — R072 Precordial pain: Secondary | ICD-10-CM

## 2022-11-17 DIAGNOSIS — I10 Essential (primary) hypertension: Secondary | ICD-10-CM

## 2022-11-17 LAB — BASIC METABOLIC PANEL
BUN/Creatinine Ratio: 12 (ref 12–28)
BUN: 10 mg/dL (ref 8–27)
CO2: 25 mmol/L (ref 20–29)
Calcium: 10 mg/dL (ref 8.7–10.3)
Chloride: 103 mmol/L (ref 96–106)
Creatinine, Ser: 0.82 mg/dL (ref 0.57–1.00)
Glucose: 100 mg/dL — ABNORMAL HIGH (ref 70–99)
Potassium: 4.5 mmol/L (ref 3.5–5.2)
Sodium: 138 mmol/L (ref 134–144)
eGFR: 79 mL/min/{1.73_m2} (ref >59)

## 2022-11-17 MED ORDER — METOPROLOL TARTRATE 50 MG PO TABS: ORAL_TABLET | ORAL | 0 refills | Status: DC

## 2022-11-17 NOTE — Progress Notes (Signed)
Cardiology Office Note   Date:  11/17/2022   ID:  Veronica Schmidt, DOB 1955/12/16, MRN [id number]  PCP:  Aurea Graff.Marlou Sa, MD    No chief complaint on file.  Chest pain  Wt Readings from Last 3 Encounters:  11/17/22 244 lb 3.2 oz (110.8 kg)  09/11/21 250 lb (113.4 kg)  11/04/20 244 lb (110.7 kg)       History of Present Illness: Veronica Schmidt is a 67 y.o. female who is being seen today for the evaluation of  at the request of Alroy Dust, L.Marlou Sa, MD.   She has had a history of hypertension, obesity and sleep apnea.  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  Sharp, left sided chest pain. Sx returned a few months ago while she was not stressed.  No particular triggers for her sharp chest pain. Not doing any regular exercise.  Playing with granddaughter is most physically strenuous activity, but pain does not Occur at that time.    Past Medical History:  Diagnosis Date   Anemia    Ankle pain, right    Chest pain    Constipation    Diverticulitis    Estrogen deficiency    Hypercalcemia    Hypertension    Obesity    OSA (obstructive sleep apnea)    Sleep disturbance    Snores    Urine frequency    Wears glasses     Past Surgical History:  Procedure Laterality Date   BREAST SURGERY     br bx-lt   COLONOSCOPY     CYST REMOVAL TRUNK Left 05/09/2013   Procedure: EXCISION OF UPPER BACK SEBACEOUS CYST REMOVAL ;  Surgeon: Gayland Curry, MD;  Location: [facility];  Service: General;  Laterality: Left;   TOTAL ABDOMINAL HYSTERECTOMY  01/21/2002     Current Outpatient Medications  Medication Sig Dispense Refill   losartan (COZAAR) 100 MG tablet Take 100 mg by mouth daily.     verapamil (VERELAN PM) 240 MG 24 hr capsule Take 240 mg by mouth at bedtime.     amLODipine (NORVASC) 10 MG tablet amlodipine (Patient not taking: Reported on 11/17/2022)     amLODIPine Besylate (NORVASC PO) amlodipine (Patient not taking: Reported on  11/17/2022)     amoxicillin-clavulanate (AUGMENTIN) 875-125 MG tablet Take 1 tablet by mouth every 12 (twelve) hours. (Patient not taking: Reported on 11/17/2022) 14 tablet 0   diclofenac (CATAFLAM) 50 MG tablet diclofenac potassium 50 mg tablet (Patient not taking: Reported on 11/17/2022)     hydrochlorothiazide (HYDRODIURIL) 25 MG tablet Take 25 mg by mouth daily. (Patient not taking: Reported on 11/17/2022)     hydrochlorothiazide (MICROZIDE) 12.5 MG capsule hydrochlorothiazide (Patient not taking: Reported on 11/17/2022)     ibuprofen (ADVIL,MOTRIN) 400 MG tablet Take 1 tablet (400 mg total) by mouth every 6 (six) hours as needed. (Patient not taking: Reported on 11/17/2022) 30 tablet 0   losartan-hydrochlorothiazide (HYZAAR) 100-25 MG per tablet Take 1 tablet by mouth daily.  (Patient not taking: Reported on 11/17/2022)     losartan-hydrochlorothiazide (HYZAAR) 100-25 MG tablet Take 1 tablet by mouth daily. (Patient not taking: Reported on 11/17/2022)     methylPREDNISolone (MEDROL DOSEPAK) 4 MG TBPK tablet Take as directed (Patient not taking: Reported on 11/17/2022) 21 tablet 0   metoprolol tartrate (LOPRESSOR) 25 MG tablet Take 25 mg by mouth 2 (two) times daily. (Patient not taking: Reported on 11/17/2022)     metoprolol  tartrate (LOPRESSOR) 25 MG tablet Take 25 mg by mouth 2 (two) times daily. (Patient not taking: Reported on 11/17/2022)     Multiple Vitamin (MULTIVITAMIN WITH MINERALS) TABS tablet Take 1 tablet by mouth daily. (Patient not taking: Reported on 11/17/2022)     Multiple Vitamins-Minerals (MULTIVITAMIN WITH MINERALS) tablet Take 1 tablet by mouth daily. (Patient not taking: Reported on 11/17/2022)     oxyCODONE-acetaminophen (ROXICET) 5-325 MG per tablet Take 1-2 tablets by mouth every 4 (four) hours as needed for pain. (Patient not taking: Reported on 10/12/2015) 40 tablet 0   predniSONE (DELTASONE) 10 MG tablet prednisone 10 mg tablet  Take 6 tablets po x1 day, 5 tablets po x 1 day, 4 tablets po x 1  day, 3 tablets po x 1 day, 2 tablets po x 1 day, 1 tablet po x 1 day (Patient not taking: Reported on 11/17/2022)     traMADol (ULTRAM) 50 MG tablet tramadol 50 mg tablet  TAKE 1 TABLET BY MOUTH THREE TIMES DAILY AS NEEDED (Patient not taking: Reported on 11/17/2022)     verapamil (CALAN-SR) 120 MG CR tablet verapamil ER (SR) 120 mg tablet,extended release     No current facility-administered medications for this visit.    Allergies:   Amlodipine, Amlodipine besylate, and Diltiazem hcl    Social History:  The patient  reports that she has never smoked. She has never used smokeless tobacco. She reports that she does not drink alcohol and does not use drugs.   Family History:  The patient's family history is not on file.    ROS:  Please see the history of present illness.   Otherwise, review of systems are positive for difficulty losing weight and not exercising regularly.   All other systems are reviewed and negative.    PHYSICAL EXAM: VS:  BP (!) 170/110   Pulse 67   Ht 5\' 4"  (1.626 m)   Wt 244 lb 3.2 oz (110.8 kg)   SpO2 96%   BMI 41.92 kg/m  , BMI Body mass index is 41.92 kg/m. GEN: Well nourished, well developed, in no acute distress HEENT: normal Neck: no JVD, carotid bruits, or masses Cardiac: RRR; no murmurs, rubs, or gallops,no edema  Respiratory:  clear to auscultation bilaterally, normal work of breathing GI: soft, nontender, nondistended, + BS MS: no deformity or atrophy Skin: warm and dry, no rash Neuro:  Strength and sensation are intact Psych: euthymic mood, full affect   EKG:   The ekg ordered today demonstrates NSR, normal ECG   Recent Labs: No results found for requested labs within last 365 days.   Lipid Panel No results found for: "CHOL", "TRIG", "HDL", "CHOLHDL", "VLDL", "LDLCALC", "LDLDIRECT"   Other studies Reviewed: Additional studies/ records that were reviewed today with results demonstrating: LDL 90.   ASSESSMENT AND PLAN:  Precordial chest  pain: Some atypical features.  Several RF for CAD.  Plan for CTA coronaries to evaluate for obstructive CAD.  She has several risk factors.  Consider statin based on calcium score.  Continue verapamil.  Can take metoprolol 50 mg x 1 prior to the CTA. Prediabetic: whole food, plant based diet.  Increase exercise. Obesity: We stressed the importance of weight loss.  Avoid Added sugars. HTN: BP typically in the 150/80s.  Taking verapamil and losartan.  45-month trial of regular exercise exam and focusing on low-sodium diet, avoiding processed foods. Recommend whole food, plant based diet.     Current medicines are reviewed at length with the  patient today.  The patient concerns regarding her medicines were addressed.  The following changes have been made:  No change  Labs/ tests ordered today include: CTA No orders of the defined types were placed in this encounter.   Recommend 150 minutes/week of aerobic exercise Low fat, low carb, high fiber diet recommended  Disposition:   FU in for CTA   Signed, Larae Grooms, MD  11/17/2022 11:20 AM    [facility] [place], [place], [place]  [address] Phone: [phone]; Fax: [phone]

## 2022-11-17 NOTE — Patient Instructions (Signed)
Medication Instructions:  Your physician recommends that you continue on your current medications as directed. Please refer to the Current Medication list given to you today.  *If you need a refill on your cardiac medications before your next appointment, please call your pharmacy*   Lab Work: Lab work to be done today--BMP If you have labs (blood work) drawn today and your tests are completely normal, you will receive your results only by: Veronica Schmidt (if you have MyChart) OR A paper copy in the mail If you have any lab test that is abnormal or we need to change your treatment, we will call you to review the results.   Testing/Procedures: Your physician has requested that you have cardiac CT. Cardiac computed tomography (CT) is a painless test that uses an x-ray machine to take clear, detailed pictures of your heart. For further information please visit [email or web address].[email or web address]. Please follow instruction sheet as given.     Follow-Up: At [facility], you and your health needs are our priority.  As part of our continuing mission to provide you with exceptional heart care, we have created designated Provider Care Teams.  These Care Teams include your primary Cardiologist (physician) and Advanced Practice Providers (APPs -  Physician Assistants and Nurse Practitioners) who all work together to provide you with the care you need, when you need it.  We recommend signing up for the patient portal called "MyChart".  Sign up information is provided on this After Visit Summary.  MyChart is used to connect with patients for Virtual Visits (Telemedicine).  Patients are able to view lab/test results, encounter notes, upcoming appointments, etc.  Non-urgent messages can be sent to your provider as well.   To learn more about what you can do with MyChart, go to [email or web address].    Your next appointment:   Based on results  Provider:   Larae Grooms, MD     Other  Instructions   Your cardiac CT will be scheduled at one of the below locations:   [facility] [address] [place], [place] [address] [phone]  [place] [address] [place], [place] [address] [phone]  [facility] [place],  [address] [phone]  If scheduled at [facility], please arrive at the [facility] (Entrance C2) of [facility] 30 minutes prior to test start time. You can use the FREE valet parking offered at entrance C (encouraged to control the heart rate for the test)  Proceed to the [facility] Radiology Department (first floor) to check-in and test prep.  All radiology patients and guests should use entrance C2 at [facility], accessed from [facility], even though the hospital's physical address listed is [address].    If scheduled at [facility] or [facility], please arrive 15 mins early for check-in and test prep.   Please follow these instructions carefully (unless otherwise directed):  Hold all erectile dysfunction medications at least 3 days (72 hrs) prior to test. (Ie viagra, cialis, sildenafil, tadalafil, etc) We will administer nitroglycerin during this exam.   On the Night Before the Test: Be sure to Drink plenty of water. Do not consume any caffeinated/decaffeinated beverages or chocolate 12 hours prior to your test. Do not take any antihistamines 12 hours prior to your test.  On the Day of the Test: Drink plenty of  water until 1 hour prior to the test. Do not eat any food 1 hour prior to test. You may take your regular medications prior to the test.  Take metoprolol (Lopressor) two hours prior to test. If you take Furosemide/Hydrochlorothiazide/Spironolactone, please HOLD on the morning of the  test. FEMALES- please wear underwire-free bra if available, avoid dresses & tight clothing         After the Test: Drink plenty of water. After receiving IV contrast, you may experience a mild flushed feeling. This is normal. On occasion, you may experience a mild rash up to 24 hours after the test. This is not dangerous. If this occurs, you can take Benadryl 25 mg and increase your fluid intake. If you experience trouble breathing, this can be serious. If it is severe call 911 IMMEDIATELY. If it is mild, please call our office. If you take any of these medications: Glipizide/Metformin, Avandament, Glucavance, please do not take 48 hours after completing test unless otherwise instructed.  We will call to schedule your test 2-4 weeks out understanding that some insurance companies will need an authorization prior to the service being performed.   For non-scheduling related questions, please contact the cardiac imaging nurse navigator should you have any questions/concerns: Marchia Bond, Cardiac Imaging Nurse Navigator Gordy Clement, Cardiac Imaging Nurse Navigator [facility] Direct Office Dial: [phone]   For scheduling needs, including cancellations and rescheduling, please call [place], [phone].

## 2022-11-18 NOTE — Addendum Note (Signed)
Addended by: Janan Halter F on: 11/18/2022 04:32 PM   Modules accepted: Orders

## 2022-11-24 ENCOUNTER — Other Ambulatory Visit: Payer: Medicare PPO

## 2022-11-30 ENCOUNTER — Telehealth (HOSPITAL_COMMUNITY): Payer: Self-pay | Admitting: *Deleted

## 2022-11-30 NOTE — Telephone Encounter (Signed)
Patient returning call about her upcoming cardiac imaging study; pt verbalizes understanding of appt date/time, parking situation and where to check in, pre-test NPO status and medications ordered, and verified current allergies; name and call back number provided for further questions should they arise  Gordy Clement RN Navigator [facility] [phone] office [phone] cell  Patient to take 35m metoprolol tartrate two hours prior to her cardiac CT scan. She is aware to arrive at 3:30pm.

## 2022-12-01 ENCOUNTER — Ambulatory Visit (HOSPITAL_COMMUNITY)
Admission: RE | Admit: 2022-12-01 | Discharge: 2022-12-01 | Disposition: A | Discharge status: Home / Self Care | Payer: Medicare PPO | Source: Ambulatory Visit | Attending: Interventional Cardiology | Admitting: Interventional Cardiology

## 2022-12-01 DIAGNOSIS — R072 Precordial pain: Secondary | ICD-10-CM | POA: Diagnosis not present

## 2022-12-01 MED ORDER — NITROGLYCERIN 0.4 MG SL SUBL
0.8000 mg | SUBLINGUAL_TABLET | Freq: Once | SUBLINGUAL | Status: AC
  Administered 2022-12-01: 0.8 mg via SUBLINGUAL

## 2022-12-01 MED ORDER — IOHEXOL 350 MG/ML SOLN
100.0000 mL | Freq: Once | INTRAVENOUS | Status: AC | PRN
  Administered 2022-12-01: 100 mL via INTRAVENOUS

## 2022-12-01 MED ORDER — NITROGLYCERIN 0.4 MG SL SUBL
SUBLINGUAL_TABLET | SUBLINGUAL | Status: AC
  Filled 2022-12-01: qty 2

## 2022-12-05 ENCOUNTER — Telehealth: Payer: Self-pay | Admitting: Interventional Cardiology

## 2022-12-05 DIAGNOSIS — R9389 Abnormal findings on diagnostic imaging of other specified body structures: Secondary | ICD-10-CM

## 2022-12-05 NOTE — Telephone Encounter (Signed)
Spoke with [facility] Radiology tech requesting Dr Irish Lack be made aware of over-read on CT/calcium score.  Copy of overread printed and taken to Dr Irish Lack to make him aware.

## 2022-12-05 NOTE — Telephone Encounter (Signed)
Calling with a call report. Please advise

## 2022-12-05 NOTE — Telephone Encounter (Signed)
Over read results reviewed by Dr Irish Lack.  Per Dr Irish Lack  OK to order CT chest with contrast as  recommended for further evaluation.  Patient notified of all results.  She will contact [facility] radiology scheduling [phone]) to schedule CT

## 2022-12-05 NOTE — Telephone Encounter (Signed)
See other phone note from today.

## 2022-12-05 NOTE — Telephone Encounter (Signed)
-----   Message from Jettie Booze, MD sent at 12/05/2022  8:22 AM EST ----- Calcium score 0.  No evidence of CAD.  Would continue risk factor modification.  If symptoms persist, let us know.  We could pursue other testing.  Otherwise, prn f/u.

## 2022-12-05 NOTE — Telephone Encounter (Signed)
Pt calling to go over Ct results.

## 2022-12-07 ENCOUNTER — Other Ambulatory Visit (HOSPITAL_BASED_OUTPATIENT_CLINIC_OR_DEPARTMENT_OTHER): Payer: Self-pay

## 2022-12-07 ENCOUNTER — Encounter (HOSPITAL_BASED_OUTPATIENT_CLINIC_OR_DEPARTMENT_OTHER): Payer: Self-pay

## 2022-12-07 ENCOUNTER — Emergency Department (HOSPITAL_BASED_OUTPATIENT_CLINIC_OR_DEPARTMENT_OTHER): Payer: Medicare PPO | Admitting: Radiology

## 2022-12-07 ENCOUNTER — Other Ambulatory Visit: Payer: Self-pay

## 2022-12-07 ENCOUNTER — Emergency Department (HOSPITAL_BASED_OUTPATIENT_CLINIC_OR_DEPARTMENT_OTHER)
Admission: EM | Admit: 2022-12-07 | Discharge: 2022-12-07 | Disposition: A | Discharge status: Home / Self Care | Payer: Medicare PPO | Attending: Emergency Medicine | Admitting: Emergency Medicine

## 2022-12-07 DIAGNOSIS — R030 Elevated blood-pressure reading, without diagnosis of hypertension: Secondary | ICD-10-CM | POA: Diagnosis not present

## 2022-12-07 DIAGNOSIS — W19XXXA Unspecified fall, initial encounter: Secondary | ICD-10-CM | POA: Diagnosis not present

## 2022-12-07 DIAGNOSIS — I10 Essential (primary) hypertension: Secondary | ICD-10-CM | POA: Diagnosis not present

## 2022-12-07 DIAGNOSIS — Z79899 Other long term (current) drug therapy: Secondary | ICD-10-CM | POA: Insufficient documentation

## 2022-12-07 DIAGNOSIS — S4991XA Unspecified injury of right shoulder and upper arm, initial encounter: Secondary | ICD-10-CM | POA: Diagnosis not present

## 2022-12-07 DIAGNOSIS — M25571 Pain in right ankle and joints of right foot: Secondary | ICD-10-CM

## 2022-12-07 DIAGNOSIS — M79671 Pain in right foot: Secondary | ICD-10-CM | POA: Diagnosis not present

## 2022-12-07 DIAGNOSIS — S86001A Unspecified injury of right Achilles tendon, initial encounter: Secondary | ICD-10-CM

## 2022-12-07 DIAGNOSIS — S99911A Unspecified injury of right ankle, initial encounter: Secondary | ICD-10-CM | POA: Diagnosis not present

## 2022-12-07 DIAGNOSIS — M19011 Primary osteoarthritis, right shoulder: Secondary | ICD-10-CM | POA: Diagnosis not present

## 2022-12-07 MED ORDER — HYDROCODONE-ACETAMINOPHEN 5-325 MG PO TABS
1.0000 | ORAL_TABLET | Freq: Once | ORAL | Status: AC
  Administered 2022-12-07: 1 via ORAL
  Filled 2022-12-07: qty 1

## 2022-12-07 MED ORDER — HYDROCODONE-ACETAMINOPHEN 5-325 MG PO TABS
1.0000 | ORAL_TABLET | Freq: Three times a day (TID) | ORAL | 0 refills | Status: AC | PRN
  Filled 2022-12-07: qty 6, 2d supply, fill #0

## 2022-12-07 NOTE — ED Notes (Signed)
Awaiting meds from pharamcy

## 2022-12-07 NOTE — ED Triage Notes (Signed)
Patient POV from Home.  Endorses getting her Foot Caught while ambulating in some crates and falling forward. Injured her Right Ankle/Foot and fell onto her Right Shoulder.  No Head injury. No LOC. No Anticoagulants. Obvious Swelling to Ankle.  NAD Noted during Triage. A&Ox4. GCS 15. BIB Wheelchair.

## 2022-12-07 NOTE — ED Notes (Signed)
Right ankle elevated onpillow and 2 ice packs applied

## 2022-12-07 NOTE — Discharge Instructions (Addendum)
At this time there does not appear to be the presence of an emergent medical condition, however there is always the potential for conditions to change. Please read and follow the below instructions.  Please return to the Emergency Department immediately for any new or worsening symptoms. Please be sure to follow up with your Primary Care Provider within one week regarding your visit today; please call their office to schedule an appointment even if you are feeling better for a follow-up visit. You may take the medication Norco (Hydrocodone/Acetaminophen) as prescribed to help with severe pain.  This medication will make you drowsy so do not drive, drink alcohol, take other sedating medications or perform any dangerous activities such as driving after taking Norco. Norco contains Tylenol (acetaminophen) so do not take any other Tylenol-containing products with Norco. You are placed in a splint today for mobilization of your right ankle.  If the splint becomes too tight or is painful please return immediately for removal and reevaluation. I am concerned that you may have injured your Achilles tendon, this will need prompt evaluation by an orthopedic specialist.  Please call Dr. Doran Durand who is the on-call orthopedic specialist today to schedule appointment please be seen within the next 2 days.  Please use rest ice and elevation.  Please do not place any weight onto your right foot until cleared to do so by the orthopedic specialist. Please have the orthopedist also evaluate your right shoulder because this may need further workup as well. Your blood pressure was elevated in the ER today, this is likely due to your pain.  Please be sure to take your blood pressure medications as prescribed by your primary care doctor and have your primary care doctor recheck your blood pressure in the next week.  If you develop any symptoms such as chest pain, difficulty breathing, headache, vision changes, numbness, tingling or  any other concerning symptoms please return immediately to the emergency department.   Please read the additional information packets attached to your discharge summary.  Go to the nearest Emergency Department immediately if: You have fever or chills You get a very bad headache. You start to feel mixed up (confused). You feel weak or numb. You feel faint. You have very bad pain in your: Chest. Belly (abdomen). You vomit more than once. You have trouble breathing. Your foot, leg, toes, or ankle: Tingles or becomes numb. Becomes swollen. Turns pale or blue.     You have any new/concerning or worsening of symptoms  Do not take your medicine if  develop an itchy rash, swelling in your mouth or lips, or difficulty breathing; call 911 and seek immediate emergency medical attention if this occurs.  You may review your lab tests and imaging results in their entirety on your MyChart account.  Please discuss all results of fully with your primary care provider and other specialist at your follow-up visit.  Note: Portions of this text may have been transcribed using voice recognition software. Every effort was made to ensure accuracy; however, inadvertent computerized transcription errors may still be present.

## 2022-12-07 NOTE — ED Notes (Signed)
Pt educated on how to use crutches. Pt demonstrated understanding and was able to ambulate using crutches effectively.

## 2022-12-07 NOTE — ED Provider Notes (Signed)
[facility] Provider Note   CSN: [id number] Arrival date & time: 12/07/22  1114     History  Chief Complaint  Patient presents with   Veronica Schmidt    Veronica Schmidt is a 67 y.o. female 3 of obesity, OSA, diverticulitis, low back pain.  Patient presented to the ER today POV, driven by her daughter for evaluation of right ankle pain and right shoulder pain after fall.  Patient was putting away some boxes today when she tripped on a crate falling forward.  She reports immediate pain to the posterior aspect of her right ankle is constant moderate intensity sharp worsens with palpation and motion improves with rest.  She reports she also fell onto her right shoulder she reports an aching right shoulder pain which is mild and constant worsens with movement and palpation and has also improved with rest.  Pain associate with swelling of the right ankle.  She denies head injury/loss of consciousness, blood thinner use, headache, neck pain, back pain, chest pain, abdominal pain, numbness, tingling or any additional concerns.  HPI     Home Medications Prior to Admission medications   Medication Sig Start Date End Date Taking? Authorizing Provider  HYDROcodone-acetaminophen (NORCO/VICODIN) 5-325 MG tablet Take 1 tablet by mouth every 8 (eight) hours as needed for up to 2 days for severe pain. 12/07/22 12/09/22 Yes Nuala Alpha A, PA-C  losartan (COZAAR) 100 MG tablet Take 100 mg by mouth daily. 07/29/20   [provider]  metoprolol tartrate (LOPRESSOR) 50 MG tablet Take one tablet by mouth 2 hours prior to CT scan 11/17/22   Jettie Booze, MD  verapamil (VERELAN PM) 240 MG 24 hr capsule Take 240 mg by mouth at bedtime.    [provider]      Allergies    Amlodipine, Amlodipine besylate, and Diltiazem hcl    Review of Systems   Review of Systems  Constitutional: Negative.  Negative for fever.  Cardiovascular:  Negative for  chest pain.  Gastrointestinal:  Negative for abdominal pain.  Musculoskeletal:  Positive for arthralgias.  Neurological: Negative.  Negative for syncope and headaches.    Physical Exam Updated Vital Signs BP (!) 181/104 (BP Location: Right Arm)   Pulse 61   Temp 98.1 F (36.7 C) (Oral)   Resp 18   Ht '5\' 4"'$  (1.626 m)   Wt 104.3 kg   SpO2 98%   BMI 39.48 kg/m  Physical Exam Constitutional:      General: She is not in acute distress.    Appearance: Normal appearance. She is well-developed. She is not ill-appearing or diaphoretic.  HENT:     Head: Normocephalic and atraumatic.     Jaw: There is normal jaw occlusion.     Right Ear: External ear normal.     Left Ear: External ear normal.     Nose: Nose normal.  Eyes:     General: Vision grossly intact. Gaze aligned appropriately.     Pupils: Pupils are equal, round, and reactive to light.  Neck:     Trachea: Trachea and phonation normal.  Cardiovascular:     Rate and Rhythm: Normal rate and regular rhythm.     Pulses:          Radial pulses are 2+ on the right side and 2+ on the left side.       Dorsalis pedis pulses are 2+ on the right side and 2+ on the left side.  Pulmonary:  Effort: Pulmonary effort is normal. No respiratory distress.     Breath sounds: Normal air entry.  Chest:     Chest wall: No tenderness.  Abdominal:     General: There is no distension.     Palpations: Abdomen is soft.     Tenderness: There is no abdominal tenderness. There is no guarding or rebound.  Musculoskeletal:        General: Normal range of motion.     Cervical back: Normal range of motion. No spinous process tenderness or muscular tenderness.     Comments: No midline spinal tenderness palpation.  No crepitus step-off or deformity spine.  No paraspinal muscular tenderness palpation. ------------------ Right shoulder: Atraumatic in appearance, no overlying skin changes or skin break.  No deformity.  No tenderness of the cervical spine,  scapular spine, acromion or clavicle.  No tenderness of the parascapular muscles, paraspinal muscles or pectoralis.  No tenderness of the biceps or triceps.  She is mildly tender along the deltoid.  No tenderness to the elbow forearm wrist or hand.  Patient has full motion of the right arm including overhead motion but with increased pain throughout.  She has some weakness with external rotation.  No pain with motion of the neck or elbow wrist or hand.  Strong radial pulse.  Capillary fill and sensation intact.  Compartments soft. ------------------------------ Right foot/ankle: Moderate swelling of the right ankle, no skin break or deformity.  No tenderness of the knee or proximal fibula.  No tenderness of the lower leg or calf.  She is tender along the Achilles tendon with a palpable defect of the Achilles tendon.  No tenderness of the calcaneus, medial/lateral malleolus, anterior ankle, midfoot/arch, tarsals, metatarsals, MTPs or toes.  She is unable to perform plantarflexion due to pain.  Limited dorsiflexion due to pain.  Motion of the toes and knee intact.  Strong pedal pulses.  Capillary fill and sensation intact.  Compartments soft.  Skin:    General: Skin is warm and dry.  Neurological:     Mental Status: She is alert.     GCS: GCS eye subscore is 4. GCS verbal subscore is 5. GCS motor subscore is 6.     Comments: Speech is clear and goal oriented, follows commands Major Cranial nerves without deficit, no facial droop Moves extremities without ataxia, coordination intact  Psychiatric:        Behavior: Behavior normal.     ED Results / Procedures / Treatments   Labs (all labs ordered are listed, but only abnormal results are displayed) Labs Reviewed - No data to display  EKG None  Radiology DG Shoulder Right  Result Date: 12/07/2022 CLINICAL DATA:  Fall.  Pain. EXAM: RIGHT SHOULDER - 2+ VIEW COMPARISON:  None Available. FINDINGS: Mild glenohumeral joint space narrowing. Mild  inferior glenoid degenerative osteophytosis. Mild acromioclavicular joint space narrowing and peripheral osteophytosis. Mild distal lateral subacromial spurring. No acute fracture or dislocation. IMPRESSION: 1. Mild glenohumeral and acromioclavicular osteoarthritis. 2. Mild distal lateral subacromial spurring. Electronically Signed   By: Yvonne Kendall M.D.   On: 12/07/2022 12:11   DG Ankle Complete Right  Result Date: 12/07/2022 CLINICAL DATA:  Fall.  Pain. EXAM: RIGHT ANKLE - COMPLETE 3+ VIEW COMPARISON:  Right ankle radiographs 08/10/2020 FINDINGS: Moderate calcaneal heel spur. Mild-to-moderate dorsal talonavicular and mild dorsal navicular-cuneiform degenerative osteophytosis. The ankle mortise is symmetric and intact. Chronic well corticated ossicles distal to the lateral and medial malleoli are unchanged, likely the sequela of remote trauma.  Moderate lateral and mild medial ankle possible soft tissue swelling, although this appears unchanged from prior. IMPRESSION: No acute fracture. Electronically Signed   By: Yvonne Kendall M.D.   On: 12/07/2022 12:10   DG Foot Complete Right  Result Date: 12/07/2022 CLINICAL DATA:  Pain after fall EXAM: RIGHT FOOT COMPLETE - 3 VIEW COMPARISON:  None Available. FINDINGS: Moderate well corticated plantar calcaneal spur. Slight degenerative changes of the first metatarsophalangeal joint with small osteophytes. Slight degenerative changes of other interphalangeal joints. No fracture or dislocation. IMPRESSION: Mild degenerative changes. Electronically Signed   By: Jill Side M.D.   On: 12/07/2022 12:08    Procedures Procedures    Medications Ordered in ED Medications  HYDROcodone-acetaminophen (NORCO/VICODIN) 5-325 MG per tablet 1 tablet (1 tablet Oral Given 12/07/22 1300)    ED Course/ Medical Decision Making/ A&P                             Medical Decision Making 67 year old female presented for right ankle pain and right shoulder pain after a trip and  fall earlier today.  She has no history of head injury, loss conscious, headache, neck pain, back pain, chest pain, abdominal pain or any other injuries or concerns.  On exam she is well-appearing and in no acute distress.  Radiographs of the right shoulder, right foot and ankle were obtained in triage.  I personally reviewed and interpreted patient's radiographs today and I do not appreciate any obvious acute displaced fracture or dislocation there is diffuse arthritis on her radiographs.  Clinically patient is very tender along the right Achilles tendon and appears to have a palpable defect there I am concerned she may have ruptured her Achilles tendon.  Patient was made aware of this and she will need prompt orthopedic evaluation.  She was placed in a posterior short leg splint and plantarflexion and provided with crutches.  She has a knee scooter at home that she can use to help keep her weight off her foot and her daughter will be helping take care of her for the next few days.  I asked for patient to be seen by orthopedist this week as she will need prompt evaluation of this.  As far as her right shoulder goes and I am concerned she may have a rotator cuff injury there, she overall retains very good range of motion but does have some pain with external rotation.  This will also need further evaluation by orthopedist.  Patient was given referral to the on-call orthopedic specialist Dr. Doran Durand today I encouraged her to call their office today for further treatment.  I reviewed patient's PDMP and she has no prior narcotic prescriptions, given her acute pain due to a suspected Achilles tendon rupture which she was provided with a short course of Norco to help with her pain, we discussed narcotic precautions today and she stated understanding.  She will use RICE therapy as well, splint was applied by technician here she remains neurovascular intact and reports it is comfortable.  Splint precautions were  discussed.  She had no other injuries or concerns today.  I did make her aware that her blood pressure was elevated today suspect this is due to her acute pain she has her blood pressure medication at home has not taken it yet today she will take it when she gets home and have her primary care doctor recheck her blood pressure in the next week.  She  is asymptomatic regarding her elevated high blood pressure reading, patient was made aware of signs/symptoms of hypertensive urgency/emergency and to call 911 and return to the ER if they occur.  Amount and/or Complexity of Data Reviewed Labs:     Details: I personally reviewed and interpreted patient's three-view radiograph of her right foot.  I do not appreciate any obvious acute displaced fracture or dislocation.  Mild osteoarthritic changes present.  Calcaneal spur present.  I personally reviewed and interpreted patient's three-view radiograph of the right ankle.  She has soft tissue swelling I do not appreciate any obvious acute displaced fracture or dislocation.  I personally reviewed and interpreted patient's three-view radiograph of the right shoulder I do not appreciate any obvious acute displaced fractures or dislocations.  She does have diffuse arthritic changes. Radiology: ordered.  Risk OTC drugs. Prescription drug management. Risk Details: Low suspicion for compartment syndrome, septic joint, cellulitis, open fracture, neurovascular compromise or other emergent causes of patient's pain at this time.     At this time there does not appear to be any evidence of an acute emergency medical condition and the patient appears stable for discharge with appropriate outpatient follow up. Diagnosis was discussed with patient who verbalizes understanding of care plan and is agreeable to discharge. I have discussed return precautions with patient who verbalizes understanding. Patient encouraged to follow-up with their PCP and orthopedist. All questions  answered.  Patient's case discussed with Dr. Rosette Reveal who agrees with plan to discharge with follow-up.   Note: Portions of this report may have been transcribed using voice recognition software. Every effort was made to ensure accuracy; however, inadvertent computerized transcription errors may still be present.         Final Clinical Impression(s) / ED Diagnoses Final diagnoses:  Acute right ankle pain  Injury of right Achilles tendon, initial encounter  Injury of right shoulder, initial encounter  Elevated blood pressure reading    Rx / DC Orders ED Discharge Orders          Ordered    HYDROcodone-acetaminophen (NORCO/VICODIN) 5-325 MG tablet  Every 8 hours PRN        12/07/22 1422              Gari Crown 12/07/22 1429    Cristie Hem, MD 12/08/22 [phone]

## 2022-12-07 NOTE — ED Notes (Signed)
Dc instructions reviewed with patient. Patient voiced understanding. Dc with belongings.  °

## 2022-12-12 DIAGNOSIS — S86011A Strain of right Achilles tendon, initial encounter: Secondary | ICD-10-CM | POA: Insufficient documentation

## 2022-12-12 DIAGNOSIS — S46011A Strain of muscle(s) and tendon(s) of the rotator cuff of right shoulder, initial encounter: Secondary | ICD-10-CM | POA: Diagnosis not present

## 2022-12-12 DIAGNOSIS — S86091A Other specified injury of right Achilles tendon, initial encounter: Secondary | ICD-10-CM | POA: Diagnosis not present

## 2022-12-21 ENCOUNTER — Ambulatory Visit (HOSPITAL_BASED_OUTPATIENT_CLINIC_OR_DEPARTMENT_OTHER)
Admission: RE | Admit: 2022-12-21 | Discharge: 2022-12-21 | Disposition: A | Discharge status: Home / Self Care | Payer: Medicare PPO | Source: Ambulatory Visit | Attending: Interventional Cardiology | Admitting: Interventional Cardiology

## 2022-12-21 ENCOUNTER — Encounter (HOSPITAL_BASED_OUTPATIENT_CLINIC_OR_DEPARTMENT_OTHER): Payer: Self-pay

## 2022-12-21 DIAGNOSIS — R9389 Abnormal findings on diagnostic imaging of other specified body structures: Secondary | ICD-10-CM

## 2022-12-21 DIAGNOSIS — R918 Other nonspecific abnormal finding of lung field: Secondary | ICD-10-CM | POA: Diagnosis not present

## 2022-12-21 MED ORDER — IOHEXOL 300 MG/ML  SOLN
100.0000 mL | Freq: Once | INTRAMUSCULAR | Status: AC | PRN
  Administered 2022-12-21: 100 mL via INTRAVENOUS

## 2022-12-26 ENCOUNTER — Telehealth: Payer: Self-pay | Admitting: Interventional Cardiology

## 2022-12-26 DIAGNOSIS — I7121 Aneurysm of the ascending aorta, without rupture: Secondary | ICD-10-CM

## 2022-12-26 NOTE — Telephone Encounter (Signed)
Dr Irish Lack would like to see patient back in a year

## 2022-12-26 NOTE — Telephone Encounter (Signed)
-----   Message from Jettie Booze, MD sent at 12/23/2022  3:37 PM EDT ----- Goiter, thyroid nodule.  Mildly dilated aorta.  Can set up chest CT in 1 year to look at aorta zize.  Please forward thyroid findings to PMD for further management.

## 2022-12-26 NOTE — Telephone Encounter (Signed)
Left message to call office

## 2022-12-26 NOTE — Telephone Encounter (Signed)
Pt is returning call about her CT results

## 2022-12-27 NOTE — Telephone Encounter (Signed)
Patient was returning call. Please advise ?

## 2022-12-28 NOTE — Telephone Encounter (Signed)
Patient notified.  Order placed for CT to be done in one year.  Patient aware to follow up with PCP regarding thyroid management

## 2022-12-29 ENCOUNTER — Other Ambulatory Visit: Payer: Medicare PPO

## 2022-12-30 ENCOUNTER — Ambulatory Visit
Admission: RE | Admit: 2022-12-30 | Discharge: 2022-12-30 | Disposition: A | Discharge status: Home / Self Care | Payer: Medicare PPO | Source: Ambulatory Visit | Attending: Family Medicine | Admitting: Family Medicine

## 2022-12-30 DIAGNOSIS — Z1231 Encounter for screening mammogram for malignant neoplasm of breast: Secondary | ICD-10-CM | POA: Diagnosis not present

## 2022-12-30 DIAGNOSIS — S86091A Other specified injury of right Achilles tendon, initial encounter: Secondary | ICD-10-CM | POA: Diagnosis not present

## 2023-01-02 ENCOUNTER — Other Ambulatory Visit: Payer: Self-pay | Admitting: Family Medicine

## 2023-01-02 DIAGNOSIS — E049 Nontoxic goiter, unspecified: Secondary | ICD-10-CM

## 2023-01-16 DIAGNOSIS — S86091A Other specified injury of right Achilles tendon, initial encounter: Secondary | ICD-10-CM | POA: Diagnosis not present

## 2023-01-30 DIAGNOSIS — S86091A Other specified injury of right Achilles tendon, initial encounter: Secondary | ICD-10-CM | POA: Diagnosis not present

## 2023-01-31 ENCOUNTER — Ambulatory Visit
Admission: RE | Admit: 2023-01-31 | Discharge: 2023-01-31 | Disposition: A | Discharge status: Home / Self Care | Payer: Medicare PPO | Source: Ambulatory Visit | Attending: Family Medicine | Admitting: Family Medicine

## 2023-01-31 DIAGNOSIS — E049 Nontoxic goiter, unspecified: Secondary | ICD-10-CM

## 2023-01-31 DIAGNOSIS — E041 Nontoxic single thyroid nodule: Secondary | ICD-10-CM | POA: Diagnosis not present

## 2023-02-07 ENCOUNTER — Other Ambulatory Visit: Payer: Self-pay | Admitting: Family Medicine

## 2023-02-07 DIAGNOSIS — E041 Nontoxic single thyroid nodule: Secondary | ICD-10-CM

## 2023-03-03 ENCOUNTER — Ambulatory Visit
Admission: RE | Admit: 2023-03-03 | Discharge: 2023-03-03 | Disposition: A | Discharge status: Home / Self Care | Payer: Medicare PPO | Source: Ambulatory Visit | Attending: Family Medicine | Admitting: Family Medicine

## 2023-03-03 ENCOUNTER — Other Ambulatory Visit (HOSPITAL_COMMUNITY)
Admission: RE | Admit: 2023-03-03 | Discharge: 2023-03-03 | Disposition: A | Discharge status: Home / Self Care | Payer: Medicare PPO | Source: Ambulatory Visit | Attending: Family Medicine | Admitting: Family Medicine

## 2023-03-03 DIAGNOSIS — E0789 Other specified disorders of thyroid: Secondary | ICD-10-CM | POA: Diagnosis not present

## 2023-03-03 DIAGNOSIS — E041 Nontoxic single thyroid nodule: Secondary | ICD-10-CM

## 2023-03-03 DIAGNOSIS — R896 Abnormal cytological findings in specimens from other organs, systems and tissues: Secondary | ICD-10-CM | POA: Diagnosis not present

## 2023-03-03 DIAGNOSIS — S86091A Other specified injury of right Achilles tendon, initial encounter: Secondary | ICD-10-CM | POA: Diagnosis not present

## 2023-03-08 LAB — CYTOLOGY - NON PAP

## 2023-03-17 DIAGNOSIS — I10 Essential (primary) hypertension: Secondary | ICD-10-CM | POA: Diagnosis not present

## 2023-03-23 DIAGNOSIS — S86091D Other specified injury of right Achilles tendon, subsequent encounter: Secondary | ICD-10-CM | POA: Diagnosis not present

## 2023-03-24 ENCOUNTER — Encounter (HOSPITAL_COMMUNITY): Payer: Self-pay

## 2023-04-07 DIAGNOSIS — S86091A Other specified injury of right Achilles tendon, initial encounter: Secondary | ICD-10-CM | POA: Diagnosis not present

## 2023-04-11 ENCOUNTER — Telehealth: Payer: Self-pay | Admitting: Interventional Cardiology

## 2023-04-11 NOTE — Telephone Encounter (Signed)
New message   Patient want to switch from Dr Eldridge Dace to Dr Duke Salvia.  She already has an appointment to be seen in the HTN clinic in August.  Is this switch ok?

## 2023-04-11 NOTE — Telephone Encounter (Signed)
OK to switch 

## 2023-04-19 DIAGNOSIS — I712 Thoracic aortic aneurysm, without rupture, unspecified: Secondary | ICD-10-CM | POA: Diagnosis not present

## 2023-04-19 DIAGNOSIS — Z6841 Body Mass Index (BMI) 40.0 and over, adult: Secondary | ICD-10-CM | POA: Diagnosis not present

## 2023-04-19 DIAGNOSIS — I10 Essential (primary) hypertension: Secondary | ICD-10-CM | POA: Diagnosis not present

## 2023-05-04 ENCOUNTER — Encounter (HOSPITAL_BASED_OUTPATIENT_CLINIC_OR_DEPARTMENT_OTHER): Payer: Self-pay

## 2023-05-04 DIAGNOSIS — R350 Frequency of micturition: Secondary | ICD-10-CM | POA: Insufficient documentation

## 2023-05-04 DIAGNOSIS — E2839 Other primary ovarian failure: Secondary | ICD-10-CM

## 2023-05-04 DIAGNOSIS — R079 Chest pain, unspecified: Secondary | ICD-10-CM

## 2023-05-04 DIAGNOSIS — I712 Thoracic aortic aneurysm, without rupture, unspecified: Secondary | ICD-10-CM | POA: Insufficient documentation

## 2023-05-04 DIAGNOSIS — I10 Essential (primary) hypertension: Secondary | ICD-10-CM

## 2023-05-05 DIAGNOSIS — S86091A Other specified injury of right Achilles tendon, initial encounter: Secondary | ICD-10-CM | POA: Diagnosis not present

## 2023-05-10 ENCOUNTER — Encounter (HOSPITAL_BASED_OUTPATIENT_CLINIC_OR_DEPARTMENT_OTHER): Payer: Self-pay

## 2023-05-11 ENCOUNTER — Encounter (HOSPITAL_BASED_OUTPATIENT_CLINIC_OR_DEPARTMENT_OTHER): Payer: Self-pay | Admitting: Family

## 2023-05-11 ENCOUNTER — Ambulatory Visit (HOSPITAL_BASED_OUTPATIENT_CLINIC_OR_DEPARTMENT_OTHER): Payer: Medicare PPO | Admitting: Family

## 2023-05-11 VITALS — BP 136/91 | HR 67 | Ht 64.0 in | Wt 255.8 lb

## 2023-05-11 DIAGNOSIS — R9389 Abnormal findings on diagnostic imaging of other specified body structures: Secondary | ICD-10-CM

## 2023-05-11 DIAGNOSIS — G4733 Obstructive sleep apnea (adult) (pediatric): Secondary | ICD-10-CM | POA: Diagnosis not present

## 2023-05-11 DIAGNOSIS — I7121 Aneurysm of the ascending aorta, without rupture: Secondary | ICD-10-CM | POA: Diagnosis not present

## 2023-05-11 DIAGNOSIS — R079 Chest pain, unspecified: Secondary | ICD-10-CM | POA: Diagnosis not present

## 2023-05-11 DIAGNOSIS — I10 Essential (primary) hypertension: Secondary | ICD-10-CM

## 2023-05-11 DIAGNOSIS — Z6841 Body Mass Index (BMI) 40.0 and over, adult: Secondary | ICD-10-CM

## 2023-05-11 DIAGNOSIS — Z006 Encounter for examination for normal comparison and control in clinical research program: Secondary | ICD-10-CM

## 2023-05-11 NOTE — Addendum Note (Signed)
Addended by: Alver Sorrow on: 05/11/2023 12:54 PM   Modules accepted: Orders

## 2023-05-11 NOTE — Patient Instructions (Addendum)
Medication Instructions:  Continue your current medications.  We will send a prior authorization to see what the cost of Wegovy would be.    Labwork: Your physician recommends that you return for lab work today: catecholamines, metanephrines, renin-aldosterone, BMP, and cortisol    Testing/Procedures: Your physician has requested that you have an echocardiogram. Echocardiography is a painless test that uses sound waves to create images of your heart. It provides your doctor with information about the size and shape of your heart and how well your heart's chambers and valves are working. This procedure takes approximately one hour. There are no restrictions for this procedure. Please do NOT wear cologne, perfume, aftershave, or lotions (deodorant is allowed). Please arrive 15 minutes prior to your appointment time.   Your physician has requested that you have a renal artery duplex. During this test, an ultrasound is used to evaluate blood flow to the kidneys. Allow one hour for this exam. Do not eat after midnight the day before and avoid carbonated beverages. Take your medications as you usually do.   Your provider has recommended a CT chest with contrast  Follow-Up: Follow up as scheduled!     Special Instructions:       Provider Referral Exercise Program (P.R.E.P.)      12 Weeks to Wellness       What is included in the PREP?  --Health Coaching and personalized exercise prescription --Full Membership to participating [facility] for the 12 weeks --Pre- and Post-consultations to assess progress and formulate an exercise plan for       continuation of exercise   What is my investment?  Your cost is $100 (reg. $144). This includes full membership privileges to [facility] in [place] and across the country. If you complete the post-assessment visit, any applicable enrollment costs will be waived if you decide to join the [facility].   Who will benefit from PREP?  People with challenges,  including (but not limited to): ---Low back pain ---Arthritis ---Hypertension ---Diabetes ---Obesity ---Joint Replacement ---Neuromuscular Disorders ---Cancer Recovery ---Weight Loss ---Many others (as determined by provider)   Get Started Today! Ask your healthcare provider to fill out a Healthcare Provider Referral for Exercise form. Be sure to turn it in before you leave the office and send/fax/email it directly to the Wellness RN to schedule your Initial Consultation. If you have family or friends that would also benefit, please share this referral with them.   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 It measured 4.3cm - we will monitor once per year until it is 4.5cm thereafter we will monitor every 6 months. The word "aneurysm" refers to a bulge in an artery (blood vessel). Most people think of them in the context of an emergency, but yours was found incidentally. At this point there is nothing you need to do from a procedure standpoint, but there are some important things to keep in mind for day-to-day life.  Mainstays of therapy for aneurysms include very good blood pressure control, healthy lifestyle, and avoiding tobacco products and street drugs. Research has raised concern that antibiotics in the fluoroquinolone class could be associated with increased risk of having an aneurysm develop or tear. This includes medicines that end in "floxacin," like Cipro or Levaquin. Make sure to discuss this information with other healthcare providers if you require antibiotics.  Since  aneurysms can run in families, you should discuss your diagnosis with first degree relatives as they may need to be screened for this. Regular mild-moderate physical exercise is important, but avoid heavy lifting/weight lifting over 30lbs, chopping wood,  shoveling snow or digging heavy earth with a shovel. It is best to avoid activities that cause grunting or straining (medically referred to as a "Valsalva maneuver"). This happens when a person bears down against a closed throat to increase the strength of arm or abdominal muscles. There's often a tendency to do this when lifting heavy weights, doing sit-ups, push-ups or chin-ups, etc., but it may be harmful.  This is a finding I would expect to be monitored periodically by your cardiology team. Most unruptured thoracic aortic aneurysms cause no symptoms, so they are often found during exams for other conditions. Contact a health care provider if you develop any discomfort in your upper back, neck, abdomen, trouble swallowing, cough or hoarseness, or unexplained weight loss. Get help right away if you develop severe pain in your upper back or abdomen that may move into your chest and arms, or any other concerning symptoms such as shortness of breath or fever.  DASH Eating Plan DASH stands for Dietary Approaches to Stop Hypertension. The DASH eating plan is a healthy eating plan that has been shown to: Lower high blood pressure (hypertension). Reduce your risk for type 2 diabetes, heart disease, and stroke. Help with weight loss. What are tips for following this plan? Reading food labels Check food labels for the amount of salt (sodium) per serving. Choose foods with less than 5 percent of the Daily Value (DV) of sodium. In general, foods with less than 300 milligrams (mg) of sodium per serving fit into this eating plan. To find whole grains, look for the word "whole" as the first word in the ingredient list. Shopping Buy products labeled as "low-sodium" or "no salt added." Buy fresh foods. Avoid canned foods and pre-made or frozen meals. Cooking Try not to add salt when you cook. Use salt-free seasonings or herbs instead of table salt or sea salt. Check with your health care provider or pharmacist  before using salt substitutes. Do not fry foods. Cook foods in healthy ways, such as baking, boiling, grilling, roasting, or broiling. Cook using oils that are good for your heart. These include olive, canola, avocado, soybean, and sunflower oil. Meal planning  Eat a balanced diet. This should include: 4 or more servings of fruits and 4 or more servings of vegetables each day. Try to fill half of your plate with fruits and vegetables. 6-8 servings of whole grains each day. 6 or less servings of lean meat, poultry, or fish each day. 1 oz is 1 serving. A 3 oz (85 g) serving of meat is about the same size as the palm of your hand. One egg is 1 oz (28 g). 2-3 servings of low-fat dairy each day. One serving is 1 cup (237 mL). 1 serving of nuts, seeds, or beans 5 times each week. 2-3 servings of heart-healthy fats. Healthy fats called omega-3 fatty acids are found in foods such as walnuts, flaxseeds, fortified milks, and eggs. These fats are also found in cold-water fish, such as sardines, salmon, and mackerel. Limit how much you eat of: Canned or prepackaged foods. Food that is high in trans fat, such as fried foods. Food that is high in saturated fat, such as fatty meat. Desserts and other sweets, sugary drinks, and other foods with added sugar.  Full-fat dairy products. Do not salt foods before eating. Do not eat more than 4 egg yolks a week. Try to eat at least 2 vegetarian meals a week. Eat more home-cooked food and less restaurant, buffet, and fast food. Lifestyle When eating at a restaurant, ask if your food can be made with less salt or no salt. If you drink alcohol: Limit how much you have to: 0-1 drink a day if you are female. 0-2 drinks a day if you are female. Know how much alcohol is in your drink. In the [place] one drink is one 12 oz bottle of beer (355 mL), one 5 oz glass of wine (148 mL), or one 1 oz glass of hard liquor (44 mL). General information Avoid eating more than 2,300  mg of salt a day. If you have hypertension, you may need to reduce your sodium intake to 1,500 mg a day. Work with your provider to stay at a healthy body weight or lose weight. Ask what the best weight range is for you. On most days of the week, get at least 30 minutes of exercise that causes your heart to beat faster. This may include walking, swimming, or biking. Work with your provider or dietitian to adjust your eating plan to meet your specific calorie needs. What foods should I eat? Fruits All fresh, dried, or frozen fruit. Canned fruits that are in their natural juice and do not have sugar added to them. Vegetables Fresh or frozen vegetables that are raw, steamed, roasted, or grilled. Low-sodium or reduced-sodium tomato and vegetable juice. Low-sodium or reduced-sodium tomato sauce and tomato paste. Low-sodium or reduced-sodium canned vegetables. Grains Whole-grain or whole-wheat bread. Whole-grain or whole-wheat pasta. Brown rice. Orpah Cobb. Bulgur. Whole-grain and low-sodium cereals. Pita bread. Low-fat, low-sodium crackers. Whole-wheat flour tortillas. Meats and other proteins Skinless chicken or [place]. Ground chicken or [place]. Pork with fat trimmed off. Fish and seafood. Egg whites. Dried beans, peas, or lentils. Unsalted nuts, nut butters, and seeds. Unsalted canned beans. Lean cuts of beef with fat trimmed off. Low-sodium, lean precooked or cured meat, such as sausages or meat loaves. Dairy Low-fat (1%) or fat-free (skim) milk. Reduced-fat, low-fat, or fat-free cheeses. Nonfat, low-sodium ricotta or cottage cheese. Low-fat or nonfat yogurt. Low-fat, low-sodium cheese. Fats and oils Soft margarine without trans fats. Vegetable oil. Reduced-fat, low-fat, or light mayonnaise and salad dressings (reduced-sodium). Canola, safflower, olive, avocado, soybean, and sunflower oils. Avocado. Seasonings and condiments Herbs. Spices. Seasoning mixes without salt. Other foods Unsalted  popcorn and pretzels. Fat-free sweets. The items listed above may not be all the foods and drinks you can have. Talk to a dietitian to learn more. What foods should I avoid? Fruits Canned fruit in a light or heavy syrup. Fried fruit. Fruit in cream or butter sauce. Vegetables Creamed or fried vegetables. Vegetables in a cheese sauce. Regular canned vegetables that are not marked as low-sodium or reduced-sodium. Regular canned tomato sauce and paste that are not marked as low-sodium or reduced-sodium. Regular tomato and vegetable juices that are not marked as low-sodium or reduced-sodium. Rosita Fire. Olives. Grains Baked goods made with fat, such as croissants, muffins, or some breads. Dry pasta or rice meal packs. Meats and other proteins Fatty cuts of meat. Ribs. Fried meat. Tomasa Blase. Bologna, salami, and other precooked or cured meats, such as sausages or meat loaves, that are not lean and low in sodium. Fat from the back of a pig (fatback). Bratwurst. Salted nuts and seeds. Canned beans with added salt.  Canned or smoked fish. Whole eggs or egg yolks. Chicken or [place] with skin. Dairy Whole or 2% milk, cream, and half-and-half. Whole or full-fat cream cheese. Whole-fat or sweetened yogurt. Full-fat cheese. Nondairy creamers. Whipped toppings. Processed cheese and cheese spreads. Fats and oils Butter. Stick margarine. Lard. Shortening. Ghee. Bacon fat. Tropical oils, such as coconut, palm kernel, or palm oil. Seasonings and condiments Onion salt, garlic salt, seasoned salt, table salt, and sea salt. Worcestershire sauce. Tartar sauce. Barbecue sauce. Teriyaki sauce. Soy sauce, including reduced-sodium soy sauce. Steak sauce. Canned and packaged gravies. Fish sauce. Oyster sauce. Cocktail sauce. Store-bought horseradish. Ketchup. Mustard. Meat flavorings and tenderizers. Bouillon cubes. Hot sauces. Pre-made or packaged marinades. Pre-made or packaged taco seasonings. Relishes. Regular salad  dressings. Other foods Salted popcorn and pretzels. The items listed above may not be all the foods and drinks you should avoid. Talk to a dietitian to learn more. Where to find more information National Heart, Lung, and Blood Institute (NHLBI): BuffaloDryCleaner.gl American Heart Association (AHA): heart.org Academy of Nutrition and Dietetics: eatright.org National Kidney Foundation (NKF): kidney.org This information is not intended to replace advice given to you by your health care provider. Make sure you discuss any questions you have with your health care provider. Document Revised: 10/13/2022 Document Reviewed: 10/13/2022 Elsevier Patient Education  2024 [employer].

## 2023-05-11 NOTE — Progress Notes (Signed)
Advanced Hypertension Clinic Initial Assessment:    Date:  05/11/2023   ID:  Veronica Schmidt, DOB 28-Oct-1955, MRN [id number]  PCP:  [name], [name].[name], MD  Cardiologist:  Chilton Si, MD  Nephrologist:  Referring MD: [name], [name].[name], MD   CC: Hypertension  History of Present Illness:    Veronica Schmidt is a 67 y.o. female with a hx of hypertension, obesity, sleep apnea, prediabetes, hypercalcemia, ascending aortic dilation here to establish care in the Advanced Hypertension Clinic.   Saw Dr. Jiles Garter 11/17/2022 due to sharp chest pain.  Coronary CTA performed 12/01/2022 with coronary calcium score of 0. However due to slab artifact mid LAD and proximal mid circumflex not well-visualized. 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  Also abnormal soft tissue density right paratracheal and medial right upper lobe and opacity versus mass versus tortuous great vessels recommended for CT chest with contrast for further evaluation. This was not performed.   Seen by PCP 02/2023 with hydrochlorothiazide increased from 12.5mg  to 25mg  due to uncontrolled hypertension.   Veronica Schmidt was diagnosed with hypertension in her 30s. She is a retired Financial controller. Notes her BP has been difficult to control. Notes he requested referral to our clinic as she was understandably frustrated by multiple medications. Not checking BP routinely at home asher arm cuff recently broke. she reports tobacco use never. Alcohol use never. For exercise she has no formal routine. she eats at home and outside of the home and does not follow low sodium diet.  She is not wearing CPAP routinely but willing to resume.  She is interested in weight loss and reducing her cardiovascular risk.  She reports chest pain. 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 Reviewed coronary calcium score of 0. She reports now feels  "like there is always something going on" on her heart which causes stress. She feels her heart beating. Also notes this makes her understandably anxious. Describes as a "constant pressure" but not a pain in her left chest. This is most notable when sitting or laying down. Will get right ankle swelling which she attributes to multiple prior injuries. No exertional dyspnea, orthopnea, PND.  We reviewed CTA with no coronary calcium though did show abnormal soft tissue density right paratracheal at medial right (adenopathy versus mass versus tortuous great vessels).   Previous labs per PCP 10/14/22: TSH 0.59 WNL 10/14/22 Hb 12.7, creatinine 0.88, GFR 73, AST 19, ALT 20, Ca 10.3 10/14/22 total cholesterol 159, triglycerides 59, HDL 57, LDL 90  Previous antihypertensives: Amlodipine-swelling Diltiazem-fatigue   Past Medical History:  Diagnosis Date   Anemia    Ankle pain, right    Chest pain    Constipation    Diverticulitis    Estrogen deficiency    Hypercalcemia    Hypertension    Obesity    OSA (obstructive sleep apnea)    Sleep disturbance    Snores    Urine frequency    Wears glasses     Past Surgical History:  Procedure Laterality Date   BREAST SURGERY     br bx-lt   COLONOSCOPY     CYST REMOVAL TRUNK Left 05/09/2013   Procedure: EXCISION OF UPPER BACK SEBACEOUS CYST REMOVAL ;  Surgeon: Atilano Ina, MD;  Location: [facility];  Service: General;  Laterality: Left;   TOTAL ABDOMINAL HYSTERECTOMY  01/21/2002    Current Medications: Current Meds  Medication  Sig   hydrochlorothiazide (HYDRODIURIL) 25 MG tablet Take 25 mg by mouth daily.   losartan (COZAAR) 100 MG tablet Take 100 mg by mouth daily.   Multiple Vitamins-Minerals (MULTIVITAMIN WOMEN 50+ PO) Take by mouth every other day.   UNABLE TO FIND Take by mouth. Med Name: Sea moss   verapamil (CALAN-SR) 240 MG CR tablet Take 240 mg by mouth daily.     Allergies:   Amlodipine, Amlodipine besylate, and  Diltiazem hcl   Social History   Socioeconomic History   Marital status: Divorced    Spouse name: Not on file   Number of children: Not on file   Years of education: Not on file   Highest education level: Not on file  Occupational History   Not on file  Tobacco Use   Smoking status: Never   Smokeless tobacco: Never  Substance and Sexual Activity   Alcohol use: No   Drug use: No   Sexual activity: Not on file  Other Topics Concern   Not on file  Social History Narrative   ** Merged History Encounter **       Social Determinants of Health   Financial Resource Strain: Low Risk  (05/11/2023)   Overall Financial Resource Strain (CARDIA)    Difficulty of Paying Living Expenses: Not hard at all  Food Insecurity: No Food Insecurity (05/11/2023)   Hunger Vital Sign    Worried About Running Out of Food in the Last Year: Never true    Ran Out of Food in the Last Year: Never true  Transportation Needs: No Transportation Needs (05/11/2023)   PRAPARE - Administrator, Civil Service (Medical): No    Lack of Transportation (Non-Medical): No  Physical Activity: Insufficiently Active (05/11/2023)   Exercise Vital Sign    Days of Exercise per Week: 1 day    Minutes of Exercise per Session: 30 min  Stress: Not on file  Social Connections: Not on file    Family History: The patient's family history includes CAD in her maternal grandmother; COPD in her father; CVA in her maternal grandfather and maternal grandmother; Diabetes in her brother and mother; Hypertension in her brother.  ROS:   Please see the history of present illness.    All other systems reviewed and are negative.  EKGs/Labs/Other Studies Reviewed:         Cardiac Studies & Procedures          CT SCANS  CT CORONARY MORPH W/CTA COR W/SCORE 12/01/2022  Addendum 12/05/2022 11:57 AM ADDENDUM REPORT: 12/05/2022 11:54  EXAM: OVER-READ INTERPRETATION  CT CHEST  The following report is an over-read performed by  radiologist Dr. Karle Barr [facility] on 12/05/2022. This over-read does not include interpretation of cardiac or coronary anatomy or pathology. The coronary CTA interpretation by the cardiologist is attached.  COMPARISON:  None.  FINDINGS: Abnormal soft tissue density RIGHT paratracheal at medial RIGHT upper lobe on scout image, question adenopathy versus mass versus tortuous great vessels. Aneurysmal dilatation ascending thoracic aorta 4.3 cm. No pericardial effusion. Pulmonary arteries patent. Esophagus unremarkable. No adenopathy. Visualized upper abdomen normal. Lungs clear. Osseous structures unremarkable.  IMPRESSION: Aneurysmal dilatation ascending thoracic aorta 4.3 cm; Recommend annual imaging followup by CTA or MRA. This recommendation follows 2010 ACCF/AHA/AATS/ACR/ASA/SCA/SCAI/SIR/STS/SVM Guidelines for the Diagnosis and Management of Patients with Thoracic Aortic Disease. Circulation. 2010; 121: V784-O962. Aortic aneurysm NOS (ICD10-I71.9) .  Abnormal soft tissue density RIGHT paratracheal at medial RIGHT upper lobe, question adenopathy versus mass  versus tortuous great vessels; CT chest with contrast recommended for further evaluation.  These results will be called to the ordering clinician or representative by the Radiologist Assistant, and communication documented in the PACS or [employer].   Electronically Signed By: Ulyses Southward M.D. On: 12/05/2022 11:54  Narrative CLINICAL DATA:  Chest pain  EXAM: Cardiac/Coronary CTA  TECHNIQUE: A non-contrast, gated CT scan was obtained with axial slices of 3 mm through the heart for calcium scoring. Calcium scoring was performed using the Agatston method. A 120 kV prospective, gated, contrast cardiac scan was obtained. Gantry rotation speed was 250 msecs and collimation was 0.6 mm. Two sublingual nitroglycerin tablets (0.8 mg) were given. The 3D data set was reconstructed in 5% intervals  of the 35-75% of the R-R cycle. Diastolic phases were analyzed on a dedicated workstation using MPR, MIP, and VRT modes. The patient received 95 cc of contrast.  FINDINGS: Image quality: Poor - slab artifact present in the proximal to mid LCx and mid LAD  Noise artifact is: Limited.  Coronary Arteries: Normal coronary origin. Co dominance without a clear PDA.  Left main: The left main is a large caliber vessel with a normal take off from the left coronary cusp that trifurcates into a LAD, LCX, and ramus intermedius. There is no plaque or stenosis.  Left anterior descending artery: The LAD gives off 2 patent diagonal branches. The proximal and distal LAD are patent but the mid LAD is not visualized due to severe slab artifact.  Ramus intermedius: Patent with no evidence of plaque or stenosis.  Left circumflex artery: The LCX is co-dominant and patent with no evidence of calcified plaque. slab artifact reduces sensitivity for detecting soft plaque in the proximal to mid LCx. The LCX gives off a right PLA.  Right coronary artery: The RCA is co-dominant with normal take off from the right coronary cusp. There is no evidence of calcified plaque but reduced sensitivity for detecting soft plaque due to significant slab artifact in the proximal to mid RCA.  Right Atrium: Right atrial size is within normal limits.  Right Ventricle: The right ventricular cavity is within normal limits.  Left Atrium: Left atrial size is normal in size with no left atrial appendage filling defect.  Left Ventricle: The ventricular cavity size is within normal limits. There are no stigmata of prior infarction. There is no abnormal filling defect.  Pulmonary arteries: Normal in size without proximal filling defect.  Pulmonary veins: Normal pulmonary venous drainage.  Pericardium: Normal thickness with no significant effusion or calcium present.  Cardiac valves: The aortic valve is trileaflet  without significant calcification. The mitral valve is normal structure without significant calcification.  Aorta: Normal caliber with no significant disease.  Extra-cardiac findings: See attached radiology report for non-cardiac structures.  IMPRESSION: 1. Coronary calcium score of 0. This was 0 percentile for age-, sex, and race-matched controls.  2.  Normal coronary origin with Co dominance with no clear PDA.  3. Normal coronary arteries. CAD RADS 0. Slab artifact significant reduces sensitivity to detect soft plaque. The mid LAD and proximal to mid LCx are not visualized.  4. Consider further testing with Stress myoview or Stress PET CT if clinical suspicion for ischemia high.  RECOMMENDATIONS: 1. CAD-RADS 0: No evidence of CAD (0%). Consider non-atherosclerotic causes of chest pain.  2. CAD-RADS 1: Minimal non-obstructive CAD (0-24%). Consider non-atherosclerotic causes of chest pain. Consider preventive therapy and risk factor modification.  3. CAD-RADS 2: Mild non-obstructive CAD (25-49%).  Consider non-atherosclerotic causes of chest pain. Consider preventive therapy and risk factor modification.  4. CAD-RADS 3: Moderate stenosis. Consider symptom-guided anti-ischemic pharmacotherapy as well as risk factor modification per guideline directed care. Additional analysis with CT FFR will be submitted.  5. CAD-RADS 4: Severe stenosis. (70-99% or > 50% left main). Cardiac catheterization or CT FFR is recommended. Consider symptom-guided anti-ischemic pharmacotherapy as well as risk factor modification per guideline directed care. Invasive coronary angiography recommended with revascularization per published guideline statements.  6. CAD-RADS 5: Total coronary occlusion (100%). Consider cardiac catheterization or viability assessment. Consider symptom-guided anti-ischemic pharmacotherapy as well as risk factor modification per guideline directed care.  7. CAD-RADS  N: Non-diagnostic study. Obstructive CAD can't be excluded. Alternative evaluation is recommended.  Armanda Magic, MD  Electronically Signed: By: Armanda Magic M.D. On: 12/02/2022 12:51           Recent Labs: 11/17/2022: BUN 10; Creatinine, Ser 0.82; Potassium 4.5; Sodium 138   Recent Lipid Panel No results found for: "CHOL", "TRIG", "HDL", "CHOLHDL", "VLDL", "LDLCALC", "LDLDIRECT"  Physical Exam:   VS:  BP (!) 136/91 Comment: right arm  Pulse 67   Ht 5\' 4"  (1.626 m)   Wt 255 lb 12.8 oz (116 kg)   BMI 43.91 kg/m  , BMI Body mass index is 43.91 kg/m. GENERAL:  Well appearing, overweight HEENT: Pupils equal round and reactive, fundi not visualized, oral mucosa unremarkable NECK:  No jugular venous distention, waveform within normal limits, carotid upstroke brisk and symmetric, no bruits, no thyromegaly LYMPHATICS:  No cervical adenopathy LUNGS:  Clear to auscultation bilaterally HEART:  RRR.  PMI not displaced or sustained,S1 and S2 within normal limits, no S3, no S4, no clicks, no rubs, no murmurs ABD:  Flat, positive bowel sounds normal in frequency in pitch, no bruits, no rebound, no guarding, no midline pulsatile mass, no hepatomegaly, no splenomegaly EXT:  2 plus pulses throughout, no edema, no cyanosis no clubbing SKIN:  No rashes no nodules NEURO:  Cranial nerves II through XII grossly intact, motor grossly intact throughout PSYCH:  Cognitively intact, oriented to person place and time   ASSESSMENT/PLAN:    HTN - BP not at goal <130/80 in clinic however is close. Presently on hydrochlorothiazide 25mg  daily, Losartan 100mg  daily, Verapamil 240mg  daily. Enrolled in [employer] RPM study.  Check in via MyChart in 2 weeks regarding blood pressure.  BP not at goal plan to transition Losartan to  Valsartan with consideration of Valsartan-hydrochlorothiazide for easier dosing. Labs today catecholamines, metanephrines, renin aldosterone, BMP, cortisol to assess for secondary  hypertension. Plan for renal artery duplex to rule out renal stenosis. BP symmetrical, not suggestive of aortic coarctation. Referred to PREP exercise program.  Chest pain in adult -atypical as occurs at rest.  Coronary CTA 11/2022 with no coronary calcium.  Plan for echocardiogram due to sensation of chest heaviness to rule out valvular abnormality or heart failure.  OSA - Not wearing CPAP routinely. Discussed importance of treating OSA in setting of HTN. She is willing to resume using.   Obesity - Weight loss via diet and exercise encouraged. Discussed the impact being overweight would have on cardiovascular risk. Will submit prior auth for [facility], as below. Referred to PREP exercise program.   Abnormal CT - CTA 11/2022 showed abnormal soft tissue density right paratracheal at medial right (adenopathy versus mass versus tortuous great vessels). Plan for CT chest with contrast for further evaluation  Cardiovascular risk factors - ASCVD risk score 8.9% which is  elevated. Additional risk factors including obesity, hypertension. Will submit prior auth for [facility] to reduce cardiovascular risk.   Ascending aortic aneurysm - CT 11/2022 4.3 cm. Continue optimal BP control. Avoid fluoroquinolones. Plan for repeat imaging 11/2023.  Screening for Secondary Hypertension:     05/11/2023    8:55 AM  Causes  Renovascular HTN Screened     - Comments 05/2023 renal duplex ordered  Sleep Apnea Screened     - Comments treated  Thyroid Disease Screened     - Comments 10/2022 normal TSH  Hyperaldosteronism Screened     - Comments 05/2023 renin-aldosterone ordered  Pheochromocytoma Screened  Cushing's Syndrome Screened  Coarctation of the Aorta Screened     - Comments BP symmetrical  Compliance Screened     - Comments taking routinely    Relevant Labs/Studies:    Latest Ref Rng & Units 11/17/2022   11:50 AM 09/11/2021    2:40 AM 11/04/2020    3:51 PM  Basic Labs  Sodium 134 - 144 mmol/L 138  136  137    Potassium 3.5 - 5.2 mmol/L 4.5  3.4  3.6   Creatinine 0.57 - 1.00 mg/dL 2.13  0.86  5.78                    05/11/2023    8:51 AM  Renovascular   Renal Artery [place] Completed Yes     she consents to be monitored in our remote patient monitoring program through [employer].  she will track his blood pressure twice daily and understands that these trends will help [place] to adjust her medications as needed prior to his next appointment.  she  interested in enrolling in the PREP exercise and nutrition program through the [facility].     Disposition:    FU with MD/PharmD in 2 months    Medication Adjustments/Labs and Tests Ordered: Current medicines are reviewed at length with the patient today.  Concerns regarding medicines are outlined above.  Orders Placed This Encounter  Procedures   CT Chest W Contrast   Catecholamines, fractionated, plasma   Metanephrines, plasma   Aldosterone + renin activity w/ ratio   Basic metabolic panel   Cortisol   Amb Referral To Provider Referral Exercise Program (P.R.E.P)   ECHOCARDIOGRAM COMPLETE   VAS US RENAL ARTERY DUPLEX   No orders of the defined types were placed in this encounter.    Signed, Alver Sorrow, NP  05/11/2023 8:55 AM    [facility]

## 2023-05-11 NOTE — Research (Unsigned)
  Subject Name: Veronica Schmidt  [name] met inclusion and exclusion criteria for the Virtual Care and Social Determinant Interventions for the management of hypertension trial.  The informed consent form, study requirements and expectations were reviewed with the subject by Dr. Duke Salvia and myself. The subject was given the opportunity to read the consent and ask questions. The subject verbalized understanding of the trial requirements.  All questions were addressed prior to the signing of the consent form. The subject agreed to participate in the trial and signed the informed consent. The informed consent was obtained prior to performance of any protocol-specific procedures for the subject.  A copy of the signed informed consent was given to the subject and a copy was placed in the subject's medical record.  [name] was randomized to Group 2.

## 2023-05-12 ENCOUNTER — Telehealth: Payer: Self-pay

## 2023-05-12 DIAGNOSIS — Z0000 Encounter for general adult medical examination without abnormal findings: Secondary | ICD-10-CM

## 2023-05-12 NOTE — Telephone Encounter (Signed)
Call to pt reference PREP referral Mailbox full Text sent to: [phone] requesting call back if interested in PREP

## 2023-05-12 NOTE — Telephone Encounter (Signed)
Conduct [employer] welcome call with patient and discussed how to properly use the cuff and app due to missing readings and patient's explanation of how she tried recording her readings this morning. Patient was made aware of the prompt times to check her blood pressure. Patient stated that the times will work with her schedule and no changes are necessary. Discussed with patient her survey responses and offered health coaching to address healthy eating and physical activity. Patient declined health coaching at this time.    Renaee Munda, MS, ERHD, [facility]  Care Guide, Health & Wellness Coach [address], [address] [place] [address] Telephone: [phone] Email: [email or web address]

## 2023-05-15 ENCOUNTER — Telehealth: Payer: Self-pay

## 2023-05-15 NOTE — Telephone Encounter (Signed)
Responded via text that she is interested in PREP.   VMT this am. Left message reference next class starting at [facility] will be 05/30/23. Requested call back to discuss.

## 2023-05-18 ENCOUNTER — Encounter (HOSPITAL_BASED_OUTPATIENT_CLINIC_OR_DEPARTMENT_OTHER): Payer: Self-pay

## 2023-05-18 NOTE — Progress Notes (Signed)
Message was sent via [employer] on 8/7 regarding the patient's survey response indicating that she had questions about her medication. Patient responded that her PCP (L. Lupe Carney) increased the hydrochlorothiazide from 12.5mg  to 25mg . However, patient expressed that her new dosage is back to 12.5mg  and this information should be updated. Informed patient that at her next visit that she can reconcile her medication list and have it updated at that time.   Renaee Munda, MS, ERHD, [facility]  Care Guide, Health & Wellness Coach [address] [place] [address] Telephone: [phone] Email: [email or web address]

## 2023-05-19 ENCOUNTER — Telehealth: Payer: Self-pay

## 2023-05-19 NOTE — Telephone Encounter (Signed)
VMT pt requesting call back to discuss PREP start.  Date change to 06/06/23. Request to set up intake appt.

## 2023-05-20 ENCOUNTER — Ambulatory Visit (HOSPITAL_BASED_OUTPATIENT_CLINIC_OR_DEPARTMENT_OTHER)
Admission: RE | Admit: 2023-05-20 | Discharge: 2023-05-20 | Disposition: A | Discharge status: Home / Self Care | Payer: Medicare PPO | Source: Ambulatory Visit | Attending: Family | Admitting: Family

## 2023-05-20 DIAGNOSIS — R9389 Abnormal findings on diagnostic imaging of other specified body structures: Secondary | ICD-10-CM | POA: Diagnosis not present

## 2023-05-20 DIAGNOSIS — I10 Essential (primary) hypertension: Secondary | ICD-10-CM | POA: Diagnosis not present

## 2023-05-20 DIAGNOSIS — G4733 Obstructive sleep apnea (adult) (pediatric): Secondary | ICD-10-CM

## 2023-05-20 DIAGNOSIS — R590 Localized enlarged lymph nodes: Secondary | ICD-10-CM | POA: Diagnosis not present

## 2023-05-20 DIAGNOSIS — R079 Chest pain, unspecified: Secondary | ICD-10-CM | POA: Diagnosis not present

## 2023-05-20 DIAGNOSIS — I7121 Aneurysm of the ascending aorta, without rupture: Secondary | ICD-10-CM

## 2023-05-20 MED ORDER — IOHEXOL 300 MG/ML  SOLN
75.0000 mL | Freq: Once | INTRAMUSCULAR | Status: AC | PRN
  Administered 2023-05-20: 75 mL via INTRAVENOUS

## 2023-05-22 ENCOUNTER — Telehealth: Payer: Self-pay | Admitting: Cardiovascular Disease

## 2023-05-22 NOTE — Telephone Encounter (Signed)
Patient is requesting call back to discuss labs. °

## 2023-05-22 NOTE — Telephone Encounter (Signed)
Call to patient who states over the weekend she had issues with her Bp monitor giving an error message will route to [employer] RN. Pt. Aware she will receive a call from [place]. Jim Like [facility] RN CCM

## 2023-05-23 ENCOUNTER — Inpatient Hospital Stay: Admission: RE | Admit: 2023-05-23 | Payer: Medicare PPO | Source: Ambulatory Visit

## 2023-05-24 ENCOUNTER — Ambulatory Visit: Admission: RE | Admit: 2023-05-24 | Payer: Medicare PPO | Source: Ambulatory Visit

## 2023-05-24 DIAGNOSIS — Z780 Asymptomatic menopausal state: Secondary | ICD-10-CM | POA: Diagnosis not present

## 2023-05-24 DIAGNOSIS — M8588 Other specified disorders of bone density and structure, other site: Secondary | ICD-10-CM | POA: Diagnosis not present

## 2023-05-24 DIAGNOSIS — E2839 Other primary ovarian failure: Secondary | ICD-10-CM

## 2023-05-26 ENCOUNTER — Encounter (HOSPITAL_BASED_OUTPATIENT_CLINIC_OR_DEPARTMENT_OTHER): Payer: Self-pay

## 2023-05-29 ENCOUNTER — Telehealth (HOSPITAL_BASED_OUTPATIENT_CLINIC_OR_DEPARTMENT_OTHER): Payer: Self-pay

## 2023-05-29 DIAGNOSIS — E049 Nontoxic goiter, unspecified: Secondary | ICD-10-CM

## 2023-05-29 NOTE — Telephone Encounter (Addendum)
Results called to patient who verbalizes understanding! 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 This is similar from previous. Recommend continue to follow with PCP. If she does not see endocrinology already, recommend referral.   Ascending aortic aneurysm stable from previous 4.0cm which is good. Does not need repeat CT angio chest until 05/2024 if we can update the existing order.

## 2023-05-30 NOTE — Progress Notes (Signed)
[facility] PREP Evaluation  Patient Details  Name: NIOKA SETZLER MRN: [id number] Date of Birth: Jul 01, 1956 Age: 67 y.o. PCP: [name], L.Dean, MD  Vitals:   05/30/23 1356  BP: 138/76  Pulse: 66  SpO2: 97%  Weight: 256 lb 12.8 oz (116.5 kg)     [facility] Eval - 05/30/23 1300       [facility] "PREP" Location   [facility] "PREP" Location [facility]      Referral    Referring Provider Walker    Reason for referral Inactivity;Hypertension;Obesitity/Overweight    Program Start Date 06/06/23   T/TH 1p-215p x 12 wks     Measurement   Waist Circumference 47 inches    Hip Circumference 55 inches    Body fat --   E4-wouldn't register     Information for Trainer   Goals Lose weight: 24 during program, under 200lbs for longterm goal    Current Exercise some yard work, walks Insurance account manager Concerns bil achilles tendon tears, bil knee pain    Pertinent Medical History HTN, aortic aneurysm    Current Barriers none    Medications that affect exercise Medication causing dizziness/drowsiness      Timed Up and Go (TUGS)   Timed Up and Go Low risk <9 seconds   some sob when walking     Mobility and Daily Activities   I find it easy to walk up or down two or more flights of stairs. 1    I have no trouble taking out the trash. 4    I do housework such as vacuuming and dusting on my own without difficulty. 4    I can easily lift a gallon of milk (8lbs). 4    I can easily walk a mile. 1    I have no trouble reaching into high cupboards or reaching down to pick up something from the floor. 4    I do not have trouble doing out-door work such as Loss adjuster, chartered, raking leaves, or gardening. 3      Mobility and Daily Activities   I feel younger than my age. 1    I feel independent. 4    I feel energetic. 1    I live an active life.  1    I feel strong. 2    I feel healthy. 1    I feel active as other people my age. 3      How fit and strong are you.   Fit and Strong Total Score 34             Past Medical History:  Diagnosis Date   Anemia    Ankle pain, right    Chest pain    Constipation    Diverticulitis    Estrogen deficiency    Hypercalcemia    Hypertension    Obesity    OSA (obstructive sleep apnea)    Sleep disturbance    Snores    Urine frequency    Wears glasses    Past Surgical History:  Procedure Laterality Date   BREAST SURGERY     br bx-lt   COLONOSCOPY     CYST REMOVAL TRUNK Left 05/09/2013   Procedure: EXCISION OF UPPER BACK SEBACEOUS CYST REMOVAL ;  Surgeon: Atilano Ina, MD;  Location:  SURGERY CENTER;  Service: General;  Laterality: Left;   TOTAL ABDOMINAL HYSTERECTOMY  01/21/2002   Social History   Tobacco Use  Smoking Status Never  Smokeless  Tobacco Never    Bonnye Fava 05/30/2023, 2:01 PM

## 2023-06-07 ENCOUNTER — Ambulatory Visit (INDEPENDENT_AMBULATORY_CARE_PROVIDER_SITE_OTHER): Payer: Medicare PPO

## 2023-06-07 DIAGNOSIS — I7121 Aneurysm of the ascending aorta, without rupture: Secondary | ICD-10-CM

## 2023-06-07 DIAGNOSIS — I10 Essential (primary) hypertension: Secondary | ICD-10-CM

## 2023-06-07 DIAGNOSIS — R079 Chest pain, unspecified: Secondary | ICD-10-CM

## 2023-06-07 DIAGNOSIS — R9389 Abnormal findings on diagnostic imaging of other specified body structures: Secondary | ICD-10-CM

## 2023-06-07 DIAGNOSIS — G4733 Obstructive sleep apnea (adult) (pediatric): Secondary | ICD-10-CM

## 2023-06-08 ENCOUNTER — Telehealth (HOSPITAL_BASED_OUTPATIENT_CLINIC_OR_DEPARTMENT_OTHER): Payer: Self-pay | Admitting: Cardiovascular Disease

## 2023-06-08 ENCOUNTER — Other Ambulatory Visit (HOSPITAL_BASED_OUTPATIENT_CLINIC_OR_DEPARTMENT_OTHER): Payer: Self-pay

## 2023-06-08 DIAGNOSIS — I10 Essential (primary) hypertension: Secondary | ICD-10-CM

## 2023-06-08 LAB — ECHOCARDIOGRAM COMPLETE
Area-P 1/2: 4.15 cm2
S' Lateral: 1.97 cm

## 2023-06-08 NOTE — Telephone Encounter (Signed)
Pt c/o medication issue:  1. Name of Medication:   Wegovy  2. How are you currently taking this medication (dosage and times per day)?   Not taking  3. Are you having a reaction (difficulty breathing--STAT)?   4. What is your medication issue?   Patient is following-up to see if she can be prescribed this medication.

## 2023-06-08 NOTE — Progress Notes (Signed)
[facility] PREP Weekly Session  Patient Details  Name: Veronica Schmidt MRN: [id number] Date of Birth: 14-Mar-1956 Age: 67 y.o. PCP: [name], L.[name], MD  There were no vitals filed for this visit.   [facility] Weekly seesion - 06/08/23 1500       [facility] "PREP" Location   [facility] "PREP" Location [facility]      Weekly Session   Topic Discussed Goal setting and welcome to the program   scale of perceived exertion   Classes attended to date 2             Bonnye Fava 06/08/2023, 3:38 PM

## 2023-06-13 NOTE — Progress Notes (Signed)
[facility] PRE[facility] Weekly Session  Patient Details  Name: Veronica Schmidt MRN: [id number] Date of Birth: Oct 29, 1955 Age: 67 y.o. PCP: [name], L.[name], MD  Vitals:   06/13/23 1300  Weight: 253 lb (114.8 kg)     [facility] Weekly seesion - 06/13/23 1600       [facility] "PREP" Location   [facility]PREP[facility] Location [facility]      Weekly Session   Topic Discussed Importance of resistance training;Other ways to be active    Minutes exercised this week 45 minutes    Classes attended to date 3             Bonnye Fava 06/13/2023, 4:33 PM

## 2023-06-15 DIAGNOSIS — G4733 Obstructive sleep apnea (adult) (pediatric): Secondary | ICD-10-CM | POA: Diagnosis not present

## 2023-06-15 DIAGNOSIS — I10 Essential (primary) hypertension: Secondary | ICD-10-CM | POA: Diagnosis not present

## 2023-06-15 DIAGNOSIS — E042 Nontoxic multinodular goiter: Secondary | ICD-10-CM | POA: Diagnosis not present

## 2023-06-15 DIAGNOSIS — R7303 Prediabetes: Secondary | ICD-10-CM | POA: Diagnosis not present

## 2023-06-29 ENCOUNTER — Telehealth: Payer: Self-pay

## 2023-06-29 ENCOUNTER — Telehealth: Payer: Self-pay | Admitting: Cardiovascular Disease

## 2023-06-29 DIAGNOSIS — Z006 Encounter for examination for normal comparison and control in clinical research program: Secondary | ICD-10-CM

## 2023-06-29 NOTE — Telephone Encounter (Signed)
Return pt's phone call; I talked pt through downloading the [employer] app on her new phone. Pt is at her son's house and does not have b/p device with her. Pt is going to manually enter in blood pressures tonight. I will call pt back in the morning to connect her b/p monitor with the [employer] app.

## 2023-06-29 NOTE — Telephone Encounter (Signed)
Pt is requesting a callback regarding her wanting to get in contact with [facility]. She stated she helped her with [employer] health (connects and monitors BP) being on her phone and recently [place] dropped [place] so she'd like some help getting the app/program connected to her new phone again. Please advise.

## 2023-06-30 DIAGNOSIS — Z006 Encounter for examination for normal comparison and control in clinical research program: Secondary | ICD-10-CM

## 2023-06-30 NOTE — Research (Signed)
Attempted to call pt to connect her b/p monitor to her [employer] App. . No answer left a detailed message.

## 2023-07-04 NOTE — Progress Notes (Signed)
[facility] PRE[facility] Weekly Session  Patient Details  Name: LONISHA DRINKARD MRN: [id number] Date of Birth: Dec 13, 1955 Age: 67 y.o. PCP: [name], L.[name], MD  Vitals:   07/04/23 1300  Weight: 256 lb 9.6 oz (116.4 kg)     [facility] Weekly seesion - 07/04/23 1600       [facility] "PREP" Location   [facility] "PREP[facility] Location [facility]      Weekly Session   Topic Discussed Health habits    Minutes exercised this week 136.27 minutes    Classes attended to date 4             Bonnye Fava 07/04/2023, 4:01 PM

## 2023-07-11 NOTE — Progress Notes (Signed)
[facility] PREP Weekly Session  Patient Details  Name: Veronica Schmidt MRN: [id number] Date of Birth: 1956-02-14 Age: 67 y.o. PCP: [name], L.[name], MD  Vitals:   07/11/23 1505  Weight: 250 lb 12.8 oz (113.8 kg)     [facility] Weekly seesion - 07/11/23 1500       [facility] "PREP" Location   [facility]PREP[facility] Location [facility]      Weekly Session   Topic Discussed Restaurant Eating    Minutes exercised this week 140 minutes    Classes attended to date 6             Bonnye Fava 07/11/2023, 3:06 PM

## 2023-07-13 ENCOUNTER — Encounter (HOSPITAL_BASED_OUTPATIENT_CLINIC_OR_DEPARTMENT_OTHER): Payer: Self-pay | Admitting: Family

## 2023-07-13 ENCOUNTER — Ambulatory Visit (HOSPITAL_BASED_OUTPATIENT_CLINIC_OR_DEPARTMENT_OTHER): Payer: Medicare PPO | Admitting: Family

## 2023-07-13 VITALS — BP 128/81 | HR 70 | Ht 64.0 in | Wt 250.4 lb

## 2023-07-13 DIAGNOSIS — G4733 Obstructive sleep apnea (adult) (pediatric): Secondary | ICD-10-CM | POA: Diagnosis not present

## 2023-07-13 DIAGNOSIS — Z6841 Body Mass Index (BMI) 40.0 and over, adult: Secondary | ICD-10-CM | POA: Diagnosis not present

## 2023-07-13 DIAGNOSIS — I701 Atherosclerosis of renal artery: Secondary | ICD-10-CM | POA: Diagnosis not present

## 2023-07-13 DIAGNOSIS — I10 Essential (primary) hypertension: Secondary | ICD-10-CM | POA: Diagnosis not present

## 2023-07-13 DIAGNOSIS — E785 Hyperlipidemia, unspecified: Secondary | ICD-10-CM

## 2023-07-13 MED ORDER — ROSUVASTATIN CALCIUM 10 MG PO TABS
10.0000 mg | ORAL_TABLET | ORAL | 1 refills | Status: DC
Start: 2023-07-14 — End: 2023-11-22

## 2023-07-13 NOTE — Progress Notes (Signed)
[facility] Initial Assessment:    Date:  07/13/2023   ID:  Veronica Schmidt, DOB [birth year], MRN [id number]  PCP:  [name], [name].[name], MD  Cardiologist:  Chilton Si, MD  Nephrologist:  Referring MD: [name], [name].[name], MD   CC: Hypertension  History of Present Illness:    Veronica Schmidt is a 67 y.o. female with a hx of hypertension, obesity, sleep apnea, renal artery stenosis, prediabetes, hypercalcemia, ascending aortic dilation here to follow up in the [facility].   Saw Dr. [name] 11/17/2022 due to sharp chest pain.  Coronary CTA performed 12/01/2022 with coronary calcium score of 0. However due to slab artifact mid LAD and proximal mid circumflex not well-visualized. 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  Also abnormal soft tissue density right paratracheal and medial right upper lobe and opacity versus mass versus tortuous great vessels recommended for CT chest with contrast for further evaluation. This was not performed.   Seen by PCP 02/2023 with hydrochlorothiazide increased from 12.5mg  to 25mg  due to uncontrolled hypertension.   Established with [facility] 05/11/23. CELESE BANNER was diagnosed with hypertension in her 30s.  Her BP in clinic was 136/91 and reported better control at home.  She was enrolled in vivify RPM study.  She was encouraged to resume wearing CPAP regularly.  She was referred to prep exercise program.  Catecholamines, metanephrines, renin aldosterone, cortisol were unremarkable.  Renal artery duplex 05/2023 with bilateral 1-59% stenosis.  Echo 05/2023 normal LVEF, ascending aorta 43mm, no significant valvular abnormalities. 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 This was asymptomatic. Does note she may have been a bit dehydrated. Not wearing CPAP regularly, agreeable to resume wearing. Reviewed renal duplex with recommendation for lipid lowering therapy. Reports occasional "ache" in her left leg at rest, reassurance provided atypical for claudication.   Previous antihypertensives: Amlodipine-swelling Diltiazem-fatigue   Past Medical History:  Diagnosis Date   Anemia    Ankle pain, right    Chest pain    Constipation    Diverticulitis    Estrogen deficiency    Hypercalcemia    Hypertension    Obesity    OSA (obstructive sleep apnea)    Sleep disturbance    Snores    Urine frequency    Wears glasses     Past Surgical History:  Procedure Laterality Date   BREAST SURGERY     br bx-lt   COLONOSCOPY     CYST REMOVAL TRUNK Left 05/09/2013   Procedure: EXCISION OF UPPER BACK SEBACEOUS CYST REMOVAL ;  Surgeon: Atilano Ina, MD;  Location: [facility];  Service: General;  Laterality: Left;   TOTAL ABDOMINAL HYSTERECTOMY  01/21/2002    Current Medications: Current Meds  Medication Sig   hydrochlorothiazide (HYDRODIURIL) 25 MG tablet Take 25 mg by mouth daily.   losartan (COZAAR) 100 MG tablet Take 100 mg by mouth daily.   Multiple Vitamins-Minerals (MULTIVITAMIN WOMEN 50+ PO) Take by mouth every other day.   [START ON 07/14/2023] rosuvastatin (CRESTOR) 10 MG tablet Take 1 tablet (10 mg total) by mouth 3 (three) times a week.   verapamil (CALAN-SR) 240 MG CR tablet Take 240 mg by mouth daily.  [facility] Initial Assessment:    Date:  07/13/2023   ID:  Veronica Schmidt, DOB [birth year], MRN [id number]  PCP:  [name], [name].[name], MD  Cardiologist:  Chilton Si, MD  Nephrologist:  Referring MD: [name], [name].[name], MD   CC: Hypertension  History of Present Illness:    Veronica Schmidt is a 67 y.o. female with a hx of hypertension, obesity, sleep apnea, renal artery stenosis, prediabetes, hypercalcemia, ascending aortic dilation here to follow up in the [facility].   Saw Dr. [name] 11/17/2022 due to sharp chest pain.  Coronary CTA performed 12/01/2022 with coronary calcium score of 0. However due to slab artifact mid LAD and proximal mid circumflex not well-visualized. 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  Also abnormal soft tissue density right paratracheal and medial right upper lobe and opacity versus mass versus tortuous great vessels recommended for CT chest with contrast for further evaluation. This was not performed.   Seen by PCP 02/2023 with hydrochlorothiazide increased from 12.5mg  to 25mg  due to uncontrolled hypertension.   Established with [facility] 05/11/23. CELESE BANNER was diagnosed with hypertension in her 30s.  Her BP in clinic was 136/91 and reported better control at home.  She was enrolled in vivify RPM study.  She was encouraged to resume wearing CPAP regularly.  She was referred to prep exercise program.  Catecholamines, metanephrines, renin aldosterone, cortisol were unremarkable.  Renal artery duplex 05/2023 with bilateral 1-59% stenosis.  Echo 05/2023 normal LVEF, ascending aorta 43mm, no significant valvular abnormalities. 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 This was asymptomatic. Does note she may have been a bit dehydrated. Not wearing CPAP regularly, agreeable to resume wearing. Reviewed renal duplex with recommendation for lipid lowering therapy. Reports occasional "ache" in her left leg at rest, reassurance provided atypical for claudication.   Previous antihypertensives: Amlodipine-swelling Diltiazem-fatigue   Past Medical History:  Diagnosis Date   Anemia    Ankle pain, right    Chest pain    Constipation    Diverticulitis    Estrogen deficiency    Hypercalcemia    Hypertension    Obesity    OSA (obstructive sleep apnea)    Sleep disturbance    Snores    Urine frequency    Wears glasses     Past Surgical History:  Procedure Laterality Date   BREAST SURGERY     br bx-lt   COLONOSCOPY     CYST REMOVAL TRUNK Left 05/09/2013   Procedure: EXCISION OF UPPER BACK SEBACEOUS CYST REMOVAL ;  Surgeon: Atilano Ina, MD;  Location: [facility];  Service: General;  Laterality: Left;   TOTAL ABDOMINAL HYSTERECTOMY  01/21/2002    Current Medications: Current Meds  Medication Sig   hydrochlorothiazide (HYDRODIURIL) 25 MG tablet Take 25 mg by mouth daily.   losartan (COZAAR) 100 MG tablet Take 100 mg by mouth daily.   Multiple Vitamins-Minerals (MULTIVITAMIN WOMEN 50+ PO) Take by mouth every other day.   [START ON 07/14/2023] rosuvastatin (CRESTOR) 10 MG tablet Take 1 tablet (10 mg total) by mouth 3 (three) times a week.   verapamil (CALAN-SR) 240 MG CR tablet Take 240 mg by mouth daily.  Allergies:   Amlodipine, Amlodipine besylate, and Diltiazem hcl   Social History   Socioeconomic History   Marital status: Divorced    Spouse name: Not on file   Number of children: Not on file   Years of education: Not on file   Highest education level: Not on file  Occupational History   Not on file  Tobacco Use   Smoking status: Never   Smokeless  tobacco: Never  Substance and Sexual Activity   Alcohol use: No   Drug use: No   Sexual activity: Not on file  Other Topics Concern   Not on file  Social History Narrative   ** Merged History Encounter **       Social Determinants of Health   Financial Resource Strain: Low Risk  (05/11/2023)   Overall Financial Resource Strain (CARDIA)    Difficulty of Paying Living Expenses: Not hard at all  Food Insecurity: No Food Insecurity (05/11/2023)   Hunger Vital Sign    Worried About Running Out of Food in the Last Year: Never true    Ran Out of Food in the Last Year: Never true  Transportation Needs: No Transportation Needs (05/11/2023)   PRAPARE - Administrator, Civil Service (Medical): No    Lack of Transportation (Non-Medical): No  Physical Activity: Insufficiently Active (05/11/2023)   Exercise Vital Sign    Days of Exercise per Week: 1 day    Minutes of Exercise per Session: 30 min  Stress: Not on file  Social Connections: Not on file    Family History: The patient's family history includes CAD in her maternal grandmother; COPD in her father; CVA in her maternal grandfather and maternal grandmother; Diabetes in her brother and mother; Hypertension in her brother.  ROS:   Please see the history of present illness.    All other systems reviewed and are negative.  EKGs/Labs/Other Studies Reviewed:    EKG Interpretation Date/Time:  Thursday July 13 2023 08:41:08 EDT Ventricular Rate:  71 PR Interval:  178 QRS Duration:  88 QT Interval:  388 QTC Calculation: 421 R Axis:   -13  Text Interpretation: Normal sinus rhythm Normal ECG Confirmed by Gillian Shields ([id number]) on 07/13/2023 8:47:44 AM    Cardiac Studies & Procedures       ECHOCARDIOGRAM  ECHOCARDIOGRAM COMPLETE 06/07/2023  Narrative ECHOCARDIOGRAM REPORT    Patient Name:   EVANEE LUBRANO Date of Exam: 06/07/2023 Medical Rec #:  [id number]        Height:       64.0 in Accession #:    [id number]        Weight:       256.8 lb Date of Birth:  1956-02-09         BSA:          2.176 m Patient Age:    67 years         BP:           139/89 mmHg Patient Gender: F                HR:           73 bpm. Exam Location:  Outpatient  Procedure: 2D Echo, 3D Echo, Cardiac Doppler, Color Doppler and Strain Analysis  Indications:    Aneurysm of ascending aorta without rupture  History:        Patient has no prior history of Echocardiogram examinations. Signs/Symptoms:Chest Pain; Risk Factors:Hypertension and Non-Smoker. Thoracic aortic aneurysm.  [facility] Initial Assessment:    Date:  07/13/2023   ID:  Veronica Schmidt, DOB [birth year], MRN [id number]  PCP:  [name], [name].[name], MD  Cardiologist:  Chilton Si, MD  Nephrologist:  Referring MD: [name], [name].[name], MD   CC: Hypertension  History of Present Illness:    Veronica Schmidt is a 67 y.o. female with a hx of hypertension, obesity, sleep apnea, renal artery stenosis, prediabetes, hypercalcemia, ascending aortic dilation here to follow up in the [facility].   Saw Dr. [name] 11/17/2022 due to sharp chest pain.  Coronary CTA performed 12/01/2022 with coronary calcium score of 0. However due to slab artifact mid LAD and proximal mid circumflex not well-visualized. 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  Also abnormal soft tissue density right paratracheal and medial right upper lobe and opacity versus mass versus tortuous great vessels recommended for CT chest with contrast for further evaluation. This was not performed.   Seen by PCP 02/2023 with hydrochlorothiazide increased from 12.5mg  to 25mg  due to uncontrolled hypertension.   Established with [facility] 05/11/23. CELESE BANNER was diagnosed with hypertension in her 30s.  Her BP in clinic was 136/91 and reported better control at home.  She was enrolled in vivify RPM study.  She was encouraged to resume wearing CPAP regularly.  She was referred to prep exercise program.  Catecholamines, metanephrines, renin aldosterone, cortisol were unremarkable.  Renal artery duplex 05/2023 with bilateral 1-59% stenosis.  Echo 05/2023 normal LVEF, ascending aorta 43mm, no significant valvular abnormalities. 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 This was asymptomatic. Does note she may have been a bit dehydrated. Not wearing CPAP regularly, agreeable to resume wearing. Reviewed renal duplex with recommendation for lipid lowering therapy. Reports occasional "ache" in her left leg at rest, reassurance provided atypical for claudication.   Previous antihypertensives: Amlodipine-swelling Diltiazem-fatigue   Past Medical History:  Diagnosis Date   Anemia    Ankle pain, right    Chest pain    Constipation    Diverticulitis    Estrogen deficiency    Hypercalcemia    Hypertension    Obesity    OSA (obstructive sleep apnea)    Sleep disturbance    Snores    Urine frequency    Wears glasses     Past Surgical History:  Procedure Laterality Date   BREAST SURGERY     br bx-lt   COLONOSCOPY     CYST REMOVAL TRUNK Left 05/09/2013   Procedure: EXCISION OF UPPER BACK SEBACEOUS CYST REMOVAL ;  Surgeon: Atilano Ina, MD;  Location: [facility];  Service: General;  Laterality: Left;   TOTAL ABDOMINAL HYSTERECTOMY  01/21/2002    Current Medications: Current Meds  Medication Sig   hydrochlorothiazide (HYDRODIURIL) 25 MG tablet Take 25 mg by mouth daily.   losartan (COZAAR) 100 MG tablet Take 100 mg by mouth daily.   Multiple Vitamins-Minerals (MULTIVITAMIN WOMEN 50+ PO) Take by mouth every other day.   [START ON 07/14/2023] rosuvastatin (CRESTOR) 10 MG tablet Take 1 tablet (10 mg total) by mouth 3 (three) times a week.   verapamil (CALAN-SR) 240 MG CR tablet Take 240 mg by mouth daily.  [facility] Initial Assessment:    Date:  07/13/2023   ID:  Veronica Schmidt, DOB [birth year], MRN [id number]  PCP:  [name], [name].[name], MD  Cardiologist:  Chilton Si, MD  Nephrologist:  Referring MD: [name], [name].[name], MD   CC: Hypertension  History of Present Illness:    Veronica Schmidt is a 67 y.o. female with a hx of hypertension, obesity, sleep apnea, renal artery stenosis, prediabetes, hypercalcemia, ascending aortic dilation here to follow up in the [facility].   Saw Dr. [name] 11/17/2022 due to sharp chest pain.  Coronary CTA performed 12/01/2022 with coronary calcium score of 0. However due to slab artifact mid LAD and proximal mid circumflex not well-visualized. 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  Also abnormal soft tissue density right paratracheal and medial right upper lobe and opacity versus mass versus tortuous great vessels recommended for CT chest with contrast for further evaluation. This was not performed.   Seen by PCP 02/2023 with hydrochlorothiazide increased from 12.5mg  to 25mg  due to uncontrolled hypertension.   Established with [facility] 05/11/23. CELESE BANNER was diagnosed with hypertension in her 30s.  Her BP in clinic was 136/91 and reported better control at home.  She was enrolled in vivify RPM study.  She was encouraged to resume wearing CPAP regularly.  She was referred to prep exercise program.  Catecholamines, metanephrines, renin aldosterone, cortisol were unremarkable.  Renal artery duplex 05/2023 with bilateral 1-59% stenosis.  Echo 05/2023 normal LVEF, ascending aorta 43mm, no significant valvular abnormalities. 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 This was asymptomatic. Does note she may have been a bit dehydrated. Not wearing CPAP regularly, agreeable to resume wearing. Reviewed renal duplex with recommendation for lipid lowering therapy. Reports occasional "ache" in her left leg at rest, reassurance provided atypical for claudication.   Previous antihypertensives: Amlodipine-swelling Diltiazem-fatigue   Past Medical History:  Diagnosis Date   Anemia    Ankle pain, right    Chest pain    Constipation    Diverticulitis    Estrogen deficiency    Hypercalcemia    Hypertension    Obesity    OSA (obstructive sleep apnea)    Sleep disturbance    Snores    Urine frequency    Wears glasses     Past Surgical History:  Procedure Laterality Date   BREAST SURGERY     br bx-lt   COLONOSCOPY     CYST REMOVAL TRUNK Left 05/09/2013   Procedure: EXCISION OF UPPER BACK SEBACEOUS CYST REMOVAL ;  Surgeon: Atilano Ina, MD;  Location: [facility];  Service: General;  Laterality: Left;   TOTAL ABDOMINAL HYSTERECTOMY  01/21/2002    Current Medications: Current Meds  Medication Sig   hydrochlorothiazide (HYDRODIURIL) 25 MG tablet Take 25 mg by mouth daily.   losartan (COZAAR) 100 MG tablet Take 100 mg by mouth daily.   Multiple Vitamins-Minerals (MULTIVITAMIN WOMEN 50+ PO) Take by mouth every other day.   [START ON 07/14/2023] rosuvastatin (CRESTOR) 10 MG tablet Take 1 tablet (10 mg total) by mouth 3 (three) times a week.   verapamil (CALAN-SR) 240 MG CR tablet Take 240 mg by mouth daily.  [facility] Initial Assessment:    Date:  07/13/2023   ID:  Veronica Schmidt, DOB [birth year], MRN [id number]  PCP:  [name], [name].[name], MD  Cardiologist:  Chilton Si, MD  Nephrologist:  Referring MD: [name], [name].[name], MD   CC: Hypertension  History of Present Illness:    Veronica Schmidt is a 67 y.o. female with a hx of hypertension, obesity, sleep apnea, renal artery stenosis, prediabetes, hypercalcemia, ascending aortic dilation here to follow up in the [facility].   Saw Dr. [name] 11/17/2022 due to sharp chest pain.  Coronary CTA performed 12/01/2022 with coronary calcium score of 0. However due to slab artifact mid LAD and proximal mid circumflex not well-visualized. 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  Also abnormal soft tissue density right paratracheal and medial right upper lobe and opacity versus mass versus tortuous great vessels recommended for CT chest with contrast for further evaluation. This was not performed.   Seen by PCP 02/2023 with hydrochlorothiazide increased from 12.5mg  to 25mg  due to uncontrolled hypertension.   Established with [facility] 05/11/23. CELESE BANNER was diagnosed with hypertension in her 30s.  Her BP in clinic was 136/91 and reported better control at home.  She was enrolled in vivify RPM study.  She was encouraged to resume wearing CPAP regularly.  She was referred to prep exercise program.  Catecholamines, metanephrines, renin aldosterone, cortisol were unremarkable.  Renal artery duplex 05/2023 with bilateral 1-59% stenosis.  Echo 05/2023 normal LVEF, ascending aorta 43mm, no significant valvular abnormalities. 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 This was asymptomatic. Does note she may have been a bit dehydrated. Not wearing CPAP regularly, agreeable to resume wearing. Reviewed renal duplex with recommendation for lipid lowering therapy. Reports occasional "ache" in her left leg at rest, reassurance provided atypical for claudication.   Previous antihypertensives: Amlodipine-swelling Diltiazem-fatigue   Past Medical History:  Diagnosis Date   Anemia    Ankle pain, right    Chest pain    Constipation    Diverticulitis    Estrogen deficiency    Hypercalcemia    Hypertension    Obesity    OSA (obstructive sleep apnea)    Sleep disturbance    Snores    Urine frequency    Wears glasses     Past Surgical History:  Procedure Laterality Date   BREAST SURGERY     br bx-lt   COLONOSCOPY     CYST REMOVAL TRUNK Left 05/09/2013   Procedure: EXCISION OF UPPER BACK SEBACEOUS CYST REMOVAL ;  Surgeon: Atilano Ina, MD;  Location: [facility];  Service: General;  Laterality: Left;   TOTAL ABDOMINAL HYSTERECTOMY  01/21/2002    Current Medications: Current Meds  Medication Sig   hydrochlorothiazide (HYDRODIURIL) 25 MG tablet Take 25 mg by mouth daily.   losartan (COZAAR) 100 MG tablet Take 100 mg by mouth daily.   Multiple Vitamins-Minerals (MULTIVITAMIN WOMEN 50+ PO) Take by mouth every other day.   [START ON 07/14/2023] rosuvastatin (CRESTOR) 10 MG tablet Take 1 tablet (10 mg total) by mouth 3 (three) times a week.   verapamil (CALAN-SR) 240 MG CR tablet Take 240 mg by mouth daily.  Allergies:   Amlodipine, Amlodipine besylate, and Diltiazem hcl   Social History   Socioeconomic History   Marital status: Divorced    Spouse name: Not on file   Number of children: Not on file   Years of education: Not on file   Highest education level: Not on file  Occupational History   Not on file  Tobacco Use   Smoking status: Never   Smokeless  tobacco: Never  Substance and Sexual Activity   Alcohol use: No   Drug use: No   Sexual activity: Not on file  Other Topics Concern   Not on file  Social History Narrative   ** Merged History Encounter **       Social Determinants of Health   Financial Resource Strain: Low Risk  (05/11/2023)   Overall Financial Resource Strain (CARDIA)    Difficulty of Paying Living Expenses: Not hard at all  Food Insecurity: No Food Insecurity (05/11/2023)   Hunger Vital Sign    Worried About Running Out of Food in the Last Year: Never true    Ran Out of Food in the Last Year: Never true  Transportation Needs: No Transportation Needs (05/11/2023)   PRAPARE - Administrator, Civil Service (Medical): No    Lack of Transportation (Non-Medical): No  Physical Activity: Insufficiently Active (05/11/2023)   Exercise Vital Sign    Days of Exercise per Week: 1 day    Minutes of Exercise per Session: 30 min  Stress: Not on file  Social Connections: Not on file    Family History: The patient's family history includes CAD in her maternal grandmother; COPD in her father; CVA in her maternal grandfather and maternal grandmother; Diabetes in her brother and mother; Hypertension in her brother.  ROS:   Please see the history of present illness.    All other systems reviewed and are negative.  EKGs/Labs/Other Studies Reviewed:    EKG Interpretation Date/Time:  Thursday July 13 2023 08:41:08 EDT Ventricular Rate:  71 PR Interval:  178 QRS Duration:  88 QT Interval:  388 QTC Calculation: 421 R Axis:   -13  Text Interpretation: Normal sinus rhythm Normal ECG Confirmed by Gillian Shields ([id number]) on 07/13/2023 8:47:44 AM    Cardiac Studies & Procedures       ECHOCARDIOGRAM  ECHOCARDIOGRAM COMPLETE 06/07/2023  Narrative ECHOCARDIOGRAM REPORT    Patient Name:   EVANEE LUBRANO Date of Exam: 06/07/2023 Medical Rec #:  [id number]        Height:       64.0 in Accession #:    [id number]        Weight:       256.8 lb Date of Birth:  1956-02-09         BSA:          2.176 m Patient Age:    67 years         BP:           139/89 mmHg Patient Gender: F                HR:           73 bpm. Exam Location:  Outpatient  Procedure: 2D Echo, 3D Echo, Cardiac Doppler, Color Doppler and Strain Analysis  Indications:    Aneurysm of ascending aorta without rupture  History:        Patient has no prior history of Echocardiogram examinations. Signs/Symptoms:Chest Pain; Risk Factors:Hypertension and Non-Smoker. Thoracic aortic aneurysm.

## 2023-07-13 NOTE — Patient Instructions (Addendum)
Medication Instructions:  Your physician has recommended you make the following change in your medication:  START Rosuvastatin 10mg  three times per week  Labs: Your physician recommends that you return for lab work 3-5 days prior to your next office visit for fasting lipid panel, CMP  Please return for Lab work. You may come to the...   [facility] (3rd floor) [address], [place], [place] [address]  Open: 8am-Noon and 1pm-4:30pm  Please ring the doorbell on the small table when you exit the elevator and the Lab Tech will come get you  [facility] [address], [place], [place] [address] Open: 8am-1pm, then 2pm-4:30pm   [facility]- Please see attached locations sheet stapled to your lab work with address and hours.     Testing/Procedures: Your EKG today showed normal sinus rhythm which is a good result.   Follow-Up: As scheduled with Dr. Duke Salvia    Special Instructions:   To prevent palpitations: Make sure you are adequately hydrated.  Avoid and/or limit caffeine containing beverages like soda or tea. Exercise regularly.  Manage stress well. Some over the counter medications can cause palpitations such as Benadryl, AdvilPM, TylenolPM. Regular Advil or Tylenol do not cause palpitations.

## 2023-07-18 NOTE — Progress Notes (Signed)
[facility] PREP Weekly Session  Patient Details  Name: VEATRICE ECKSTEIN MRN: [id number] Date of Birth: 1956-05-17 Age: 67 y.o. PCP: [name], L.[name], MD  Vitals:   07/18/23 1300  Weight: 254 lb 9.6 oz (115.5 kg)     [facility] Weekly seesion - 07/18/23 1700       [facility] "PREP" Location   [facility]      Weekly Session   Topic Discussed Stress management and problem solving   breathwork, meditation   Minutes exercised this week 40 minutes    Classes attended to date 8             [name] 07/18/2023, 5:13 PM

## 2023-07-21 DIAGNOSIS — H2513 Age-related nuclear cataract, bilateral: Secondary | ICD-10-CM | POA: Diagnosis not present

## 2023-07-21 DIAGNOSIS — H25013 Cortical age-related cataract, bilateral: Secondary | ICD-10-CM | POA: Diagnosis not present

## 2023-07-25 NOTE — Progress Notes (Signed)
[facility] PREP Weekly Session  Patient Details  Name: Veronica Schmidt MRN: [id number] Date of Birth: Apr 15, 1956 Age: 67 y.o. PCP: [name], L.[name], MD  Vitals:   07/25/23 1417  Weight: 251 lb 12.8 oz (114.2 kg)     [facility] Weekly seesion - 07/25/23 1400       [facility] "PREP" Location   [facility]      Weekly Session   Topic Discussed Finding support    Minutes exercised this week 100 minutes    Classes attended to date 25             [name] 07/25/2023, 2:19 PM

## 2023-08-02 NOTE — Progress Notes (Signed)
[facility] PREP Weekly Session  Patient Details  Name: Veronica Schmidt MRN: [id number] Date of Birth: August 13, 1956 Age: 67 y.o. PCP: [name], L.[name], MD (Inactive)  Vitals:   08/01/23 1300  Weight: 254 lb 6.4 oz (115.4 kg)     [facility] Weekly seesion - 08/02/23 1400       [facility]PREP[facility] Location   [facility]      Weekly Session   Topic Discussed --   portion size and reading labels   Minutes exercised this week 175 minutes    Classes attended to date 10             Bonnye Fava 08/02/2023, 2:55 PM

## 2023-08-08 NOTE — Progress Notes (Signed)
[facility] PREP Weekly Session  Patient Details  Name: ALEXEIA PRIVETTE MRN: [id number] Date of Birth: June 28, 1956 Age: 67 y.o. PCP: [name], L.[name], MD (Inactive)  Vitals:   08/08/23 1504  Weight: 253 lb (114.8 kg)     [facility] Weekly seesion - 08/08/23 1500       [facility] "PREP" Location   [facility] "PREP[facility] Location [facility]      Weekly Session   Topic Discussed Finding support    Minutes exercised this week 217 minutes    Classes attended to date 43             [name] 08/08/2023, 3:05 PM

## 2023-08-15 NOTE — Progress Notes (Signed)
[facility] PREP Weekly Session  Patient Details  Name: Veronica Schmidt MRN: [id number] Date of Birth: Nov 16, 1955 Age: 67 y.o. PCP: [name], L.[name], MD (Inactive)  Vitals:   08/15/23 1425  Weight: 253 lb 9.6 oz (115 kg)     [facility] Weekly seesion - 08/15/23 1400       [facility] "PREP" Location   [facility]      Weekly Session   Topic Discussed --   review of healthy lifestyle habits   Minutes exercised this week 210 minutes    Classes attended to date 47             [name] 08/15/2023, 2:26 PM

## 2023-08-22 NOTE — Progress Notes (Signed)
[facility] PREP Weekly Session  Patient Details  Name: LISL KENNIS MRN: [id number] Date of Birth: 1956/08/18 Age: 67 y.o. PCP: [name], L.[name], MD (Inactive)  Vitals:   08/22/23 1426  Weight: 252 lb (114.3 kg)     [facility] Weekly seesion - 08/22/23 1400       [facility] "PREP" Location   [facility]      Weekly Session   Topic Discussed Calorie breakdown    Minutes exercised this week 235 minutes    Classes attended to date 56             [name] 08/22/2023, 2:27 PM

## 2023-08-29 NOTE — Progress Notes (Signed)
[facility] PREP Weekly Session  Patient Details  Name: Veronica Schmidt MRN: [id number] Date of Birth: 1956-04-18 Age: 67 y.o. PCP: [name], L.[name], MD (Inactive)  Vitals:   08/29/23 1428  Weight: 255 lb 12.8 oz (116 kg)     [facility] Weekly seesion - 08/29/23 1400       [facility] "PREP" Location   [facility]      Weekly Session   Topic Discussed Hitting roadblocks    Minutes exercised this week 215 minutes    Classes attended to date 17             Bonnye Fava 08/29/2023, 2:29 PM

## 2023-09-05 NOTE — Progress Notes (Signed)
[facility] PREP Weekly Session  Patient Details  Name: Veronica Schmidt MRN: [id number] Date of Birth: 07-31-1956 Age: 67 y.o. PCP: [name], L.[name], MD (Inactive)  Vitals:   09/05/23 1448  Weight: 256 lb 6.4 oz (116.3 kg)     [facility] Weekly seesion - 09/05/23 1400       [facility] "PREP" Location   [facility]PREP[facility] Location [facility]      Weekly Session   Topic Discussed --   Final fit testing, goals   Minutes exercised this week 90 minutes    Classes attended to date 18            Final measurements scheduled for 09/12/23 at 1pm  [facility] 09/05/2023, 2:50 PM

## 2023-09-10 ENCOUNTER — Other Ambulatory Visit (INDEPENDENT_AMBULATORY_CARE_PROVIDER_SITE_OTHER): Payer: Self-pay

## 2023-09-12 NOTE — Progress Notes (Signed)
[facility] PREP Evaluation  Patient Details  Name: Veronica Schmidt MRN: [id number] Date of Birth: [birth year] Age: 67 y.o. PCP: [name], L.[name], MD (Inactive)  Vitals:   09/12/23 1340  BP: 112/78  Pulse: 81  SpO2: 97%  Weight: 255 lb 12.8 oz (116 kg)     [facility] Eval - 09/12/23 1300       [facility] "PREP" Location   [facility]PREP[facility] Location [facility]      Referral    Referring Provider Walker    Program Start Date 06/06/23    Program End Date 09/12/23      Measurement   Waist Circumference 47 inches    Waist Circumference End Program 47.5 inches    Hip Circumference 55 inches    Hip Circumference End Program 55 inches    Body fat --   unable to measure     Information for Trainer   Goals Plans on cont to exercise both aerobic and strength      Mobility and Daily Activities   I find it easy to walk up or down two or more flights of stairs. 2    I have no trouble taking out the trash. 4    I do housework such as vacuuming and dusting on my own without difficulty. 4    I can easily lift a gallon of milk (8lbs). 4    I can easily walk a mile. 1    I have no trouble reaching into high cupboards or reaching down to pick up something from the floor. 3    I do not have trouble doing out-door work such as Loss adjuster, chartered, raking leaves, or gardening. 2      Mobility and Daily Activities   I feel younger than my age. 1    I feel independent. 4    I feel energetic. 2    I live an active life.  3    I feel strong. 2    I feel healthy. 2    I feel active as other people my age. 2      How fit and strong are you.   Fit and Strong Total Score 36            Past Medical History:  Diagnosis Date   Anemia    Ankle pain, right    Chest pain    Constipation    Diverticulitis    Estrogen deficiency    Hypercalcemia    Hypertension    Obesity    OSA (obstructive sleep apnea)    Sleep disturbance    Snores    Urine frequency    Wears glasses    Past Surgical History:   Procedure Laterality Date   BREAST SURGERY     br bx-lt   COLONOSCOPY     CYST REMOVAL TRUNK Left 05/09/2013   Procedure: EXCISION OF UPPER BACK SEBACEOUS CYST REMOVAL ;  Surgeon: Atilano Ina, MD;  Location:  [facility];  Service: General;  Laterality: Left;   TOTAL ABDOMINAL HYSTERECTOMY  01/21/2002   Social History   Tobacco Use  Smoking Status Never  Smokeless Tobacco Never  Attended 18 sessions, 11 educational Fit testing:  Cardio march: 198 to 220 Sit to stand: 8 to 14 Bicep curl: 16 to 14 Encouraged to continue to work on balance, strength train and cardio for lung and heart health.  Manage stress Encouraged to focus on breath and use 4-7-8 a couple times per day  Bonnye Fava 09/12/2023, 1:43 PM

## 2023-09-13 ENCOUNTER — Telehealth (HOSPITAL_BASED_OUTPATIENT_CLINIC_OR_DEPARTMENT_OTHER): Payer: Self-pay | Admitting: *Deleted

## 2023-09-13 ENCOUNTER — Telehealth (HOSPITAL_BASED_OUTPATIENT_CLINIC_OR_DEPARTMENT_OTHER): Payer: Self-pay | Admitting: Pharmacy Technician

## 2023-09-13 ENCOUNTER — Ambulatory Visit (HOSPITAL_BASED_OUTPATIENT_CLINIC_OR_DEPARTMENT_OTHER): Payer: Medicare PPO | Admitting: Cardiovascular Disease

## 2023-09-13 ENCOUNTER — Other Ambulatory Visit (HOSPITAL_COMMUNITY): Payer: Self-pay

## 2023-09-13 ENCOUNTER — Encounter (HOSPITAL_BASED_OUTPATIENT_CLINIC_OR_DEPARTMENT_OTHER): Payer: Self-pay | Admitting: Cardiovascular Disease

## 2023-09-13 VITALS — BP 155/86 | HR 78 | Ht 64.0 in | Wt 258.3 lb

## 2023-09-13 DIAGNOSIS — I10 Essential (primary) hypertension: Secondary | ICD-10-CM | POA: Diagnosis not present

## 2023-09-13 DIAGNOSIS — I712 Thoracic aortic aneurysm, without rupture, unspecified: Secondary | ICD-10-CM | POA: Diagnosis not present

## 2023-09-13 DIAGNOSIS — Z5181 Encounter for therapeutic drug level monitoring: Secondary | ICD-10-CM

## 2023-09-13 HISTORY — DX: Morbid (severe) obesity due to excess calories: E66.01

## 2023-09-13 MED ORDER — WEGOVY 0.25 MG/0.5ML ~~LOC~~ SOAJ: 0.2500 mg | SUBCUTANEOUS | 0 refills | Status: AC

## 2023-09-13 MED ORDER — SPIRONOLACTONE 25 MG PO TABS: 25.0000 mg | ORAL_TABLET | Freq: Every day | ORAL | 1 refills | Status: DC

## 2023-09-13 MED ORDER — WEGOVY 0.5 MG/0.5ML ~~LOC~~ SOAJ: 0.5000 mg | SUBCUTANEOUS | 3 refills | Status: DC

## 2023-09-13 NOTE — Telephone Encounter (Signed)
Pharmacy Patient Advocate Encounter   Received notification from Pt Calls Messages that prior authorization for wegovy is required/requested.   Insurance verification completed.   The patient is insured through [employer] .   Per test claim: PA required; PA submitted to above mentioned insurance via CoverMyMeds Key/confirmation #/EOC [id number] Status is pending

## 2023-09-13 NOTE — Progress Notes (Signed)
[facility] Initial Assessment:    Date:  09/13/2023   ID:  Veronica Schmidt, DOB 1955/11/03, MRN [id number]  PCP:  [name], [name].[name], MD (Inactive)  Cardiologist:  Chilton Si, MD  Nephrologist:  Referring MD: [name], [name].[name], MD   CC: Hypertension  History of Present Illness:    Veronica Schmidt is a 67 y.o. female with a hx of hypertension, OSA on CPAP, obesity, mild renal artery stenosis, prediabetes and mild ascending aorta aneurysm here for follow up.  She first established care in the [facility] 05/2023.  Prior to that she saw Dr. Eldridge Dace 11/2022 with chest pain.  Coronary CT-A showed no obstructive disease and a calcium score of 0.  Her ascending aorta was 4.3 cm.  She saw her PCP 02/2023 and hydrochlorothiazide was increased.    Veronica Schmidt was diagnosed with hypertension in her 30s.  At her initial visit she was enrolled in the [employer] RPM study nad referred to PREP.  Renal Doppler 05/2023 revealed 1-59% stenosis bilaterally.  Echo was unremarkable other than aorta 4.3 cm. BP was well-controlled at her visit 07/2023.  She was started on rosuvastatin due to renal artery stenosis.  She was encouraged to use her CPAP.  Veronica Schmidt presents with a primary concern of a diagnosed aortic aneurysm, which she describes as feeling like a "walking time bomb." She reports no specific symptoms related to this condition but expresses significant anxiety and stress related to the potential risks associated with the aneurysm. The patient also mentions a restriction on lifting more than 25 pounds due to the aneurysm, which poses a challenge as she frequently needs to lift her grandchild who weighs around this limit.  [facility] not to lift more than 20-25 pounds. She expresses a desire to know if she could do lower weights and is concerned about the potential impact of her physical activities on her  condition.  In addition to the aneurysm, the patient reports inconsistent blood pressure readings, with no discernible pattern. She notes that her blood pressure can be "pretty good" one day and then "haywire" the next, despite not consuming unhealthy foods. She acknowledges potential stress factors in her life, including caring for her 17 year old mother and a daughter facing medical issues, but does not perceive these as causing significant stress.  Veronica Schmidt has been engaging in exercise, [facility], and reports feeling fine during these activities. She does, however, occasionally experience a sensation of pressure or a dull ache in her chest, which can occur at any time, not specifically during exercise.  She has been on verapamil for blood pressure control for a long time and reports variable blood pressure readings, ranging from 112/78 to the high 140s/90s. She expresses frustration with the lack of improvement in her blood pressure despite long-term medication use. She also reports occasional palpitations, particularly when lying on her right side.  The patient has been considering trying Wegovy, a medication for weight management, and is interested in exploring this option further. She acknowledges a recent increase in eating out due to family circumstances but tries to make healthier choices such as salads and avoiding fried foods.      Previous antihypertensives:    Past Medical History:  Diagnosis Date   Anemia    Ankle pain, right    Chest pain    Constipation    Diverticulitis  Estrogen deficiency    Hypercalcemia    Hypertension    Morbid obesity (HCC) 09/13/2023   Obesity    OSA (obstructive sleep apnea)    Sleep disturbance    Snores    Urine frequency    Wears glasses     Past Surgical History:  Procedure Laterality Date   BREAST SURGERY     br bx-lt   COLONOSCOPY     CYST REMOVAL TRUNK Left 05/09/2013   Procedure: EXCISION OF  UPPER BACK SEBACEOUS CYST REMOVAL ;  Surgeon: Atilano Ina, MD;  Location: [facility];  Service: General;  Laterality: Left;   TOTAL ABDOMINAL HYSTERECTOMY  01/21/2002    Current Medications: Current Meds  Medication Sig   hydrochlorothiazide (HYDRODIURIL) 25 MG tablet Take 25 mg by mouth daily.   losartan (COZAAR) 100 MG tablet Take 100 mg by mouth daily.   Multiple Vitamins-Minerals (MULTIVITAMIN WOMEN 50+ PO) Take by mouth every other day.   rosuvastatin (CRESTOR) 10 MG tablet Take 1 tablet (10 mg total) by mouth 3 (three) times a week.   Semaglutide-Weight Management (WEGOVY) 0.25 MG/0.5ML SOAJ Inject 0.25 mg into the skin once a week for 28 days. FOR 4 WEEKS   [START ON 10/11/2023] Semaglutide-Weight Management (WEGOVY) 0.5 MG/0.5ML SOAJ Inject 0.5 mg into the skin once a week. WHEN YOU FINISH THE 0.25 MG   spironolactone (ALDACTONE) 25 MG tablet Take 1 tablet (25 mg total) by mouth daily.   verapamil (CALAN-SR) 240 MG CR tablet Take 240 mg by mouth daily.     Allergies:   Amlodipine, Amlodipine besylate, and Diltiazem hcl   Social History   Socioeconomic History   Marital status: Divorced    Spouse name: Not on file   Number of children: Not on file   Years of education: Not on file   Highest education level: Not on file  Occupational History   Not on file  Tobacco Use   Smoking status: Never   Smokeless tobacco: Never  Substance and Sexual Activity   Alcohol use: No   Drug use: No   Sexual activity: Not on file  Other Topics Concern   Not on file  Social History Narrative   ** Merged History Encounter **       Social Determinants of Health   Financial Resource Strain: Low Risk  (05/11/2023)   Overall Financial Resource Strain (CARDIA)    Difficulty of Paying Living Expenses: Not hard at all  Food Insecurity: No Food Insecurity (05/11/2023)   Hunger Vital Sign    Worried About Running Out of Food in the Last Year: Never true    Ran Out of Food in the  Last Year: Never true  Transportation Needs: No Transportation Needs (05/11/2023)   PRAPARE - Administrator, Civil Service (Medical): No    Lack of Transportation (Non-Medical): No  Physical Activity: Insufficiently Active (05/11/2023)   Exercise Vital Sign    Days of Exercise per Week: 1 day    Minutes of Exercise per Session: 30 min  Stress: Not on file  Social Connections: Not on file     Family History: The patient's family history includes CAD in her maternal grandmother; COPD in her father; CVA in her maternal grandfather and maternal grandmother; Diabetes in her brother and mother; Hypertension in her brother.  ROS:   Please see the history of present illness.     All other systems reviewed and are negative.  EKGs/Labs/Other Studies Reviewed:  EKG:  EKG is not ordered today.   Recent Labs: 05/11/2023: BUN 28; Creatinine, Ser 1.09; Potassium 4.3; Sodium 141   Recent Lipid Panel No results found for: "CHOL", "TRIG", "HDL", "CHOLHDL", "VLDL", "LDLCALC", "LDLDIRECT"  Physical Exam:   VS:  BP (!) 155/86   Pulse 78   Ht 5\' 4"  (1.626 m)   Wt 258 lb 4.8 oz (117.2 kg)   SpO2 97%   BMI 44.34 kg/m  , BMI Body mass index is 44.34 kg/m. GENERAL:  Well appearing HEENT: Pupils equal round and reactive, fundi not visualized, oral mucosa unremarkable NECK:  No jugular venous distention, waveform within normal limits, carotid upstroke brisk and symmetric, no bruits, no thyromegaly LUNGS:  Clear to auscultation bilaterally HEART:  RRR.  PMI not displaced or sustained,S1 and S2 within normal limits, no S3, no S4, no clicks, no rubs, no murmurs ABD:  Flat, positive bowel sounds normal in frequency in pitch, no bruits, no rebound, no guarding, no midline pulsatile mass, no hepatomegaly, no splenomegaly EXT:  2 plus pulses throughout, no edema, no cyanosis no clubbing SKIN:  No rashes no nodules NEURO:  Cranial nerves II through XII grossly intact, motor grossly intact  throughout PSYCH:  Cognitively intact, oriented to person place and time   ASSESSMENT/PLAN:    # Mild ascending aortic aneurysm Mild dilation of the aorta (4.3 cm). Discussed the importance of blood pressure control and regular imaging to monitor size. No current indication for surgical intervention. -Continue current lifestyle modifications including weight restrictions.  # Hypertension Blood pressure readings varying from 120s to 150s, averaging around 130s. Discussed the impact of diet, stress, and medication adherence on blood pressure control. -Add Spironolactone 25mg  daily to current regimen. -Check basic metabolic panel in 1 week to monitor potassium levels. -Encouraged to track blood pressure readings.  # Weight Management # Morid Obesity: Discussed potential use of Wegovy for weight management, pending insurance coverage. -Provide prescription for Wegovy to be filled if insurance coverage is adequate. - Encouraged to continue exercise after PREP completes.   # OSA: Continue CPAP  Follow-up in a couple of months or sooner if needed.      Screening for Secondary Hypertension:     05/11/2023    8:55 AM  Causes  Renovascular HTN Screened     - Comments 05/2023 renal duplex ordered  Sleep Apnea Screened     - Comments treated  Thyroid Disease Screened     - Comments 10/2022 normal TSH  Hyperaldosteronism Screened     - Comments 05/2023 renin-aldosterone ordered  Pheochromocytoma Screened  Cushing's Syndrome Screened  Coarctation of the Aorta Screened     - Comments BP symmetrical  Compliance Screened     - Comments taking routinely    Relevant Labs/Studies:    Latest Ref Rng & Units 05/11/2023    9:50 AM 11/17/2022   11:50 AM 09/11/2021    2:40 AM  Basic Labs  Sodium 134 - 144 mmol/L 141  138  136   Potassium 3.5 - 5.2 mmol/L 4.3  4.5  3.4   Creatinine 0.57 - 1.00 mg/dL 1.02  7.25  3.66           Latest Ref Rng & Units 05/11/2023    9:50 AM  Renin/Aldosterone    Aldosterone 0.0 - 30.0 ng/dL 4.2   Aldos/Renin Ratio 0.0 - 30.0 2.8        Latest Ref Rng & Units 05/11/2023    9:50 AM  Metanephrines/Catecholamines  Epinephrine 0 - 62 pg/mL 24   Norepinephrine 0 - 874 pg/mL 799   Dopamine 0 - 48 pg/mL 34   Metanephrines 0.0 - 88.0 pg/mL Comment   Normetanephrines  0.0 - 285.2 pg/mL 102.8           06/07/2023   10:35 AM  Renovascular   Renal Artery [place] Completed Yes     Disposition:    FU with MD/PharmD in 2 months   Medication Adjustments/Labs and Tests Ordered: Current medicines are reviewed at length with the patient today.  Concerns regarding medicines are outlined above.  Orders Placed This Encounter  Procedures   Basic metabolic panel   Meds ordered this encounter  Medications   spironolactone (ALDACTONE) 25 MG tablet    Sig: Take 1 tablet (25 mg total) by mouth daily.    Dispense:  90 tablet    Refill:  1   Semaglutide-Weight Management (WEGOVY) 0.25 MG/0.5ML SOAJ    Sig: Inject 0.25 mg into the skin once a week for 28 days. FOR 4 WEEKS    Dispense:  2 mL    Refill:  0   Semaglutide-Weight Management (WEGOVY) 0.5 MG/0.5ML SOAJ    Sig: Inject 0.5 mg into the skin once a week. WHEN YOU FINISH THE 0.25 MG    Dispense:  2 mL    Refill:  3     Signed, Chilton Si, MD  09/13/2023 11:03 AM    [facility]

## 2023-09-13 NOTE — Patient Instructions (Addendum)
Medication Instructions:  START SPIRONOLACTONE 25 MG DAILY   START WEGOVY  INJECT 0.25 MG WEEKLY FOR 4 WEEKS  INCREASE TO 0.5 MG WEEKLY UNTIL FOLLOW UP   Labwork: BMET IN 1 WEEK   Testing/Procedures: NONE  Follow-Up: 2 MONTHS WITH DR Telford OR CA[facility]LIN W NP IN [facility]V [facility]N CLINIC   Any Other Special Instructions Will Be Listed Below (If Applicable). CONTINUE TO MONITOR YOUR BLOOD PRESSURE TWICE A DAY. BRING YOUR VIVIFY BLOOD PRESSURE MACHINE TO FOLLOW UP   If you need a refill on your cardiac medications before your next appointment, please call your pharmacy.

## 2023-09-13 NOTE — Telephone Encounter (Signed)
Patient seen today and Rx for [facility] given  Will forward to PA team to initiate

## 2023-09-13 NOTE — Telephone Encounter (Signed)
PA request has been Submitted. New Encounter created for follow up. For additional info see Pharmacy Prior Auth telephone encounter from 09/13/23.

## 2023-09-14 NOTE — Telephone Encounter (Signed)
Pharmacy Patient Advocate Encounter  Received notification from [facility] that Prior Authorization for wegovy has been DENIED.  See denial reason below. No denial letter attached in CMM. Will attach denial letter to Media tab once received.   PA #/Case ID/Reference #: [id number]

## 2023-09-15 NOTE — Telephone Encounter (Signed)
Mychart message sent to patient advising denied

## 2023-10-11 ENCOUNTER — Other Ambulatory Visit (INDEPENDENT_AMBULATORY_CARE_PROVIDER_SITE_OTHER): Payer: Self-pay

## 2023-11-11 ENCOUNTER — Other Ambulatory Visit (INDEPENDENT_AMBULATORY_CARE_PROVIDER_SITE_OTHER): Payer: Self-pay

## 2023-11-22 ENCOUNTER — Encounter (HOSPITAL_BASED_OUTPATIENT_CLINIC_OR_DEPARTMENT_OTHER): Payer: Self-pay | Admitting: Cardiovascular Disease

## 2023-11-22 ENCOUNTER — Ambulatory Visit (HOSPITAL_BASED_OUTPATIENT_CLINIC_OR_DEPARTMENT_OTHER): Payer: Medicare PPO | Admitting: Cardiovascular Disease

## 2023-11-22 VITALS — BP 127/85 | HR 78 | Ht 63.5 in | Wt 252.0 lb

## 2023-11-22 DIAGNOSIS — I701 Atherosclerosis of renal artery: Secondary | ICD-10-CM

## 2023-11-22 DIAGNOSIS — I10 Essential (primary) hypertension: Secondary | ICD-10-CM

## 2023-11-22 DIAGNOSIS — E785 Hyperlipidemia, unspecified: Secondary | ICD-10-CM | POA: Diagnosis not present

## 2023-11-22 DIAGNOSIS — Z5181 Encounter for therapeutic drug level monitoring: Secondary | ICD-10-CM

## 2023-11-22 MED ORDER — ROSUVASTATIN CALCIUM 10 MG PO TABS
10.0000 mg | ORAL_TABLET | ORAL | 3 refills | Status: DC
Start: 2023-11-22 — End: 2024-06-20

## 2023-11-22 MED ORDER — SPIRONOLACTONE 25 MG PO TABS: 25.0000 mg | ORAL_TABLET | Freq: Every day | ORAL | 3 refills | Status: DC

## 2023-11-22 MED ORDER — IRBESARTAN 300 MG PO TABS: 300.0000 mg | ORAL_TABLET | Freq: Every evening | ORAL | 1 refills | Status: DC

## 2023-11-22 NOTE — Progress Notes (Signed)
[facility] Initial Assessment:    Date:  11/22/2023   ID:  Veronica Schmidt, DOB [birth year], MRN [id number]  PCP:  Asencion Gowda.[name], MD (Inactive)  Cardiologist:  Chilton Si, MD  Nephrologist:  Referring MD: No ref. provider found   CC: Hypertension  History of Present Illness:    Veronica Schmidt is a 68 y.o. 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  She first established care in the [facility] 05/2023.  Prior to that she saw Dr. Eldridge Dace 11/2022 with chest pain.  Coronary CT-A showed no obstructive disease and a calcium score of 0.  Her ascending aorta was 4.3 cm.  She saw her PCP 02/2023 and hydrochlorothiazide was increased.    Veronica Schmidt was diagnosed with hypertension in her 30s.  At her initial visit she was enrolled in the Vivify RPM study nad referred to [facility].  Renal Doppler 05/2023 revealed 1-59% stenosis bilaterally.  Echo was unremarkable other than aorta 4.3 cm. BP was well-controlled at her visit 07/2023.  She was started on rosuvastatin due to renal artery stenosis.  She was encouraged to use her CPAP.  At her appointment 09/2023 she was concerned about her newly diagnosed aortic aneurysm.  She was exercising at the [facility].  Spironolactone 25 mg daily was added to her regimen.  Veronica Schmidt reports inconsistent blood pressure readings ranging from 116/78 to 150/104 mmHg, with better control in the evening. She takes her blood pressure medication in the morning and checks her blood pressure at varying times, from 40 minutes to two hours after medication intake. Her exercise routine has been inconsistent due to caregiving responsibilities.  She was prescribed rosuvastatin for cholesterol management in October but has not been taking it. She started spironolactone but missed her lab appointment and is currently out of the  medication, requiring a refill. Her current medications include hydrochlorothiazide and spironolactone in the morning, and she plans to finish her current supply of losartan before switching to a new medication. She also takes verapamil.  She experiences significant stress due to caregiving responsibilities and a close friend's cancer diagnosis. She does not frequently discuss her stress with others and has not been able to exercise recently, although she acknowledges improvement in her condition when she did exercise.     Veronica Schmidt reports inconsistent blood pressure readings ranging from 116/78 to 150/104 mmHg, with better control in the evening. She takes her blood pressure medication in the morning and checks her blood pressure at varying times, from 40 minutes to two hours after medication intake. Her exercise routine has been inconsistent due to caregiving responsibilities.  She was prescribed rosuvastatin for cholesterol management in October but has not been taking it. She started spironolactone but missed her lab appointment and is currently out of the medication, requiring a refill. Her current medications include hydrochlorothiazide and spironolactone in the morning, and she plans to finish her current supply of losartan before switching to a new medication. She also takes verapamil.  She experiences significant stress due to caregiving responsibilities and a close friend's cancer diagnosis. She does not frequently discuss her stress with others and has not been able to exercise recently, although she acknowledges improvement in her condition when she did exercise.     Previous antihypertensives:    Past Medical History:  Diagnosis Date   Anemia    Ankle pain,  right    Chest pain    Constipation    Diverticulitis    Estrogen deficiency    Hypercalcemia    Hypertension    Morbid obesity (HCC) 09/13/2023   Obesity    OSA (obstructive sleep apnea)    Sleep disturbance    Snores     Urine frequency    Wears glasses     Past Surgical History:  Procedure Laterality Date   BREAST SURGERY     br bx-lt   COLONOSCOPY     CYST REMOVAL TRUNK Left 05/09/2013   Procedure: EXCISION OF UPPER BACK SEBACEOUS CYST REMOVAL ;  Surgeon: Atilano Ina, MD;  Location: [facility];  Service: General;  Laterality: Left;   TOTAL ABDOMINAL HYSTERECTOMY  01/21/2002    Current Medications: Current Meds  Medication Sig   hydrochlorothiazide (HYDRODIURIL) 25 MG tablet Take 25 mg by mouth daily.   irbesartan (AVAPRO) 300 MG tablet Take 1 tablet (300 mg total) by mouth every evening.   verapamil (CALAN-SR) 240 MG CR tablet Take 240 mg by mouth daily.   [DISCONTINUED] losartan (COZAAR) 100 MG tablet Take 100 mg by mouth daily.   [DISCONTINUED] Multiple Vitamins-Minerals (MULTIVITAMIN WOMEN 50+ PO) Take by mouth every other day.   [DISCONTINUED] Semaglutide-Weight Management (WEGOVY) 0.5 MG/0.5ML SOAJ Inject 0.5 mg into the skin once a week. WHEN YOU FINISH THE 0.25 MG   [DISCONTINUED] spironolactone (ALDACTONE) 25 MG tablet Take 1 tablet (25 mg total) by mouth daily.   [DISCONTINUED] UNABLE TO FIND Take by mouth. Med Name: Sea moss     Allergies:   Amlodipine, Amlodipine besylate, and Diltiazem hcl   Social History   Socioeconomic History   Marital status: Divorced    Spouse name: Not on file   Number of children: Not on file   Years of education: Not on file   Highest education level: Not on file  Occupational History   Not on file  Tobacco Use   Smoking status: Never   Smokeless tobacco: Never  Substance and Sexual Activity   Alcohol use: No   Drug use: No   Sexual activity: Not on file  Other Topics Concern   Not on file  Social History Narrative   ** Merged History Encounter **       Social Drivers of Health   Financial Resource Strain: Low Risk  (05/11/2023)   Overall Financial Resource Strain (CARDIA)    Difficulty of Paying Living Expenses: Not hard  at all  Food Insecurity: No Food Insecurity (05/11/2023)   Hunger Vital Sign    Worried About Running Out of Food in the Last Year: Never true    Ran Out of Food in the Last Year: Never true  Transportation Needs: No Transportation Needs (05/11/2023)   PRAPARE - Administrator, Civil Service (Medical): No    Lack of Transportation (Non-Medical): No  Physical Activity: Insufficiently Active (05/11/2023)   Exercise Vital Sign    Days of Exercise per Week: 1 day    Minutes of Exercise per Session: 30 min  Stress: Not on file  Social Connections: Not on file     Family History: The patient's family history includes CAD in her maternal grandmother; COPD in her father; CVA in her maternal grandfather and maternal grandmother; Diabetes in her brother and mother; Hypertension in her brother.  ROS:   Please see the history of present illness.     All other systems reviewed and are negative.  EKGs/Labs/Other Studies Reviewed:    EKG:  EKG is not ordered today.   Recent Labs: 05/11/2023: BUN 28; Creatinine, Ser 1.09; Potassium 4.3; Sodium 141   Recent Lipid Panel No results found for: "CHOL", "TRIG", "HDL", "CHOLHDL", "VLDL", "LDLCALC", "LDLDIRECT"  Physical Exam:   VS:  BP 127/85   Pulse 78   Ht 5' 3.5" (1.613 m)   Wt 252 lb (114.3 kg)   SpO2 99%   BMI 43.94 kg/m  , BMI Body mass index is 43.94 kg/m. GENERAL:  Well appearing HEENT: Pupils equal round and reactive, fundi not visualized, oral mucosa unremarkable NECK:  No jugular venous distention, waveform within normal limits, carotid upstroke brisk and symmetric, no bruits, no thyromegaly LUNGS:  Clear to auscultation bilaterally HEART:  RRR.  PMI not displaced or sustained,S1 and S2 within normal limits, no S3, no S4, no clicks, no rubs, no murmurs ABD:  Flat, positive bowel sounds normal in frequency in pitch, no bruits, no rebound, no guarding, no midline pulsatile mass, no hepatomegaly, no splenomegaly EXT:  2 plus  pulses throughout, no edema, no cyanosis no clubbing SKIN:  No rashes no nodules NEURO:  Cranial nerves II through XII grossly intact, motor grossly intact throughout PSYCH:  Cognitively intact, oriented to person place and time   ASSESSMENT/PLAN:    # Hypertension Variable blood pressure readings at home, with some readings in the hypertensive range. Discussed the importance of consistent medication timing and the potential benefit of splitting medication dosing to morning and evening. -Switch Losartan to Irbesartan 300mg  when due for refill. -Change ARB and Verapamil dosing to evening. -Check blood pressure at home regularly and record readings.  # Hyperlipidemia Patient was not aware of the prescription for Rosuvastatin. -Refill Rosuvastatin and instruct patient to start taking it.  # Weight Management Patient reports lack of exercise and no weight loss. Discussed the importance of regular exercise and maintaining a healthy weight. -Encourage regular exercise and healthy diet.  # Stress Management Patient reports high stress levels due to caregiving responsibilities. Discussed the importance of stress management and offered resources. -Refer to Renaee Munda, care guide, for stress management strategies.  # General Health Maintenance -Check labs for Spironolactone today. -Plan fasting labs for cholesterol check before next appointment. -Follow up in a few months.      Screening for Secondary Hypertension:     05/11/2023    8:55 AM  Causes  Renovascular HTN Screened     - Comments 05/2023 renal duplex ordered  Sleep Apnea Screened     - Comments treated  Thyroid Disease Screened     - Comments 10/2022 normal TSH  Hyperaldosteronism Screened     - Comments 05/2023 renin-aldosterone ordered  Pheochromocytoma Screened  Cushing's Syndrome Screened  Coarctation of the Aorta Screened     - Comments BP symmetrical  Compliance Screened     - Comments taking routinely    Relevant  Labs/Studies:    Latest Ref Rng & Units 05/11/2023    9:50 AM 11/17/2022   11:50 AM 09/11/2021    2:40 AM  Basic Labs  Sodium 134 - 144 mmol/L 141  138  136   Potassium 3.5 - 5.2 mmol/L 4.3  4.5  3.4   Creatinine 0.57 - 1.00 mg/dL 2.72  5.36  6.44           Latest Ref Rng & Units 05/11/2023    9:50 AM  Renin/Aldosterone   Aldosterone 0.0 - 30.0 ng/dL 4.2   Aldos/Renin Ratio  0.0 - 30.0 2.8        Latest Ref Rng & Units 05/11/2023    9:50 AM  Metanephrines/Catecholamines   Epinephrine 0 - 62 pg/mL 24   Norepinephrine 0 - 874 pg/mL 799   Dopamine 0 - 48 pg/mL 34   Metanephrines 0.0 - 88.0 pg/mL Comment   Normetanephrines  0.0 - 285.2 pg/mL 102.8           06/07/2023   10:35 AM  Renovascular   Renal Artery [place] Completed Yes     Disposition:    FU with MD/PharmD in 2 months   Medication Adjustments/Labs and Tests Ordered: Current medicines are reviewed at length with the patient today.  Concerns regarding medicines are outlined above.  Orders Placed This Encounter  Procedures   Basic metabolic panel   Lipid panel   Comprehensive metabolic panel   Meds ordered this encounter  Medications   irbesartan (AVAPRO) 300 MG tablet    Sig: Take 1 tablet (300 mg total) by mouth every evening.    Dispense:  90 tablet    Refill:  1    D/C LOSARTAN   rosuvastatin (CRESTOR) 10 MG tablet    Sig: Take 1 tablet (10 mg total) by mouth 3 (three) times a week.    Dispense:  45 tablet    Refill:  3   spironolactone (ALDACTONE) 25 MG tablet    Sig: Take 1 tablet (25 mg total) by mouth daily.    Dispense:  90 tablet    Refill:  3     Signed, Chilton Si, MD  11/22/2023 12:19 PM    [facility]

## 2023-11-22 NOTE — Patient Instructions (Addendum)
Medication Instructions:  STOP LOSARTAN   START IRBESARTAN 300 MG DAILY   TAKE THE VERAPAMIL AND IRBESARTAN IN THE EVENING   Labwork: BMET TODAY   FASTING LP/CMET IN 3 MONTHS ABOUT A WEEK PRIOR TO FOLLOW UP   Testing/Procedures: NONE  Follow-Up: 3 MONTHS WITH CAITLIN W NP OR DR [place]   Any Other Special Instructions Will Be Listed Below (If Applicable).  MONITOR YOUR BLOOD PRESSURE AND BRING YOUR READINGS TO YOUR FOLLOW UP APPOINTMENT   If you need a refill on your cardiac medications before your next appointment, please call your pharmacy.

## 2023-11-23 LAB — BASIC METABOLIC PANEL
BUN/Creatinine Ratio: 14 (ref 12–28)
BUN: 15 mg/dL (ref 8–27)
CO2: 20 mmol/L (ref 20–29)
Calcium: 10.3 mg/dL (ref 8.7–10.3)
Chloride: 101 mmol/L (ref 96–106)
Creatinine, Ser: 1.11 mg/dL — ABNORMAL HIGH (ref 0.57–1.00)
Glucose: 100 mg/dL — ABNORMAL HIGH (ref 70–99)
Potassium: 4.3 mmol/L (ref 3.5–5.2)
Sodium: 138 mmol/L (ref 134–144)
eGFR: 55 mL/min/{1.73_m2} — ABNORMAL LOW (ref >59)

## 2023-11-26 ENCOUNTER — Encounter (HOSPITAL_BASED_OUTPATIENT_CLINIC_OR_DEPARTMENT_OTHER): Payer: Self-pay

## 2024-03-08 ENCOUNTER — Encounter (HOSPITAL_BASED_OUTPATIENT_CLINIC_OR_DEPARTMENT_OTHER): Payer: Self-pay | Admitting: Cardiovascular Disease

## 2024-03-08 ENCOUNTER — Ambulatory Visit (HOSPITAL_BASED_OUTPATIENT_CLINIC_OR_DEPARTMENT_OTHER): Payer: Medicare PPO | Admitting: Cardiovascular Disease

## 2024-03-08 VITALS — BP 142/84 | HR 72 | Ht 63.5 in | Wt 255.8 lb

## 2024-03-08 DIAGNOSIS — I7121 Aneurysm of the ascending aorta, without rupture: Secondary | ICD-10-CM | POA: Diagnosis not present

## 2024-03-08 DIAGNOSIS — I10 Essential (primary) hypertension: Secondary | ICD-10-CM

## 2024-03-08 DIAGNOSIS — E7800 Pure hypercholesterolemia, unspecified: Secondary | ICD-10-CM | POA: Diagnosis not present

## 2024-03-08 HISTORY — DX: Pure hypercholesterolemia, unspecified: E78.00

## 2024-03-08 NOTE — Patient Instructions (Addendum)
 Medication Instructions:  TAKE THE MEDICATIONS AS LISTED ON YOUR MEDICATION LIST  DO NOT START THE SPIRONOLACTONE    Labwork: FASTING LP/CMET ABOUT A WEEK PRIOR TO FOLLOW UP   Testing/Procedures: Your physician has requested that you have an echocardiogram. Echocardiography is a painless test that uses sound waves to create images of your heart. It provides your doctor with information about the size and shape of your heart and how well your heart's chambers and valves are working. This procedure takes approximately one hour. There are no restrictions for this procedure. Please do NOT wear cologne, perfume, aftershave, or lotions (deodorant is allowed). Please arrive 15 minutes prior to your appointment time.  Please note: We ask at that you not bring children with you during ultrasound (echo/ vascular) testing. Due to room size and safety concerns, children are not allowed in the ultrasound rooms during exams. Our front office staff cannot provide observation of children in our lobby area while testing is being conducted. An adult accompanying a patient to their appointment will only be allowed in the ultrasound room at the discretion of the ultrasound technician under special circumstances. We apologize for any inconvenience.  TO BE DONE IN AUGUST ABOUT A WEEK PRIOR TO FOLLOW UP   Follow-Up: 3 MONTHS WITH EITHER CAITLIN W NP OR DR [place]   Any Other Special Instructions Will Be Listed Below (If Applicable).  Have your granddaughter's pediatrician see if she is a candidate for [employer] Technical brewer for [employer]).

## 2024-03-08 NOTE — Progress Notes (Signed)
 Advanced Hypertension Clinic Initial Assessment:    Date:  03/08/2024   ID:  Veronica Schmidt, DOB 12/02/1955, MRN [id number]  PCP:  Elida Grounds, DO  Cardiologist:  Maudine Sos, MD  Nephrologist:  Referring MD: No ref. provider found   CC: Hypertension  History of Present Illness:    Veronica Schmidt is a 68 y.o. female with a hx of hypertension, OSA on CPAP, obesity, mild renal artery stenosis, mild aortic aneurysm, prediabetes and mild ascending aorta aneurysm here for follow up.  She first established care in the [facility] Clinic 05/2023.  Prior to that she saw Dr. Jacquelynn Matter 11/2022 with chest pain.  Coronary CT-A showed no obstructive disease and a calcium  score of 0.  Her ascending aorta was 4.3 cm.  She saw her PCP 02/2023 and hydrochlorothiazide  was increased.    Veronica Schmidt was diagnosed with hypertension in her 30s.  At her initial visit she was enrolled in the Vivify RPM study nad referred to PREP.  Renal Doppler 05/2023 revealed 1-59% stenosis bilaterally.  Echo was unremarkable other than aorta 4.3 cm. BP was well-controlled at her visit 07/2023.  She was started on rosuvastatin  due to renal artery stenosis.  She was encouraged to use her CPAP.  At her appointment 09/2023 she was concerned about her newly diagnosed aortic aneurysm.  She was exercising at the [facility].  Spironolactone  25 mg daily was added to her regimen.  At her visit 11/2023 she noted that she was not taking rosuvastatin .  She was taking spironolactone .  Blood pressures were labile ranging from 110s to 150s.  She noted that it was better in the evenings and in the mornings.  She was struggling to get her exercising due to caregiving responsibilities.  Blood pressure was well-controlled.  Losartan  was switched to irbesartan .  She was referred to our care guide for stress management.  Discussed the use of AI scribe software for clinical note transcription with the patient, who gave verbal consent to  proceed.  History of Present Illness Veronica Schmidt is experiencing confusion regarding her medication regimen, particularly between losartan  and irbesartan . She has been taking losartan  but was advised to switch to irbesartan . She has not been taking spironolactone  and brought all her medications to the appointment to clarify her regimen.  She has been consistently taking verapamil and recently ran out of hydrochlorothiazide  a few days ago. She has been monitoring her blood pressure at home, noting that it has been higher than usual, with systolic readings often over 130 and diastolic readings in the 90s. She uses an upper arm cuff for measurements.  No symptoms of lightheadedness or dizziness. She mentions that she has not been exercising as much as before but stays active by taking care of her two-year-old grandchild daily.  She is concerned about her aortic condition, recalling previous advice to avoid lifting more than 25 pounds, which is challenging as she sometimes needs to lift her grandchild. She is worried about the implications of her condition on her ability to care for her grandchild.  Her granddaughter has speech delay.  Gave info about GCPA.  Previous antihypertensives:    Past Medical History:  Diagnosis Date   Anemia    Ankle pain, right    Chest pain    Constipation    Diverticulitis    Estrogen deficiency    Hypercalcemia    Hypertension    Morbid obesity (HCC) 09/13/2023   Obesity    OSA (obstructive sleep apnea)  Pure hypercholesterolemia 03/08/2024   Sleep disturbance    Snores    Urine frequency    Wears glasses     Past Surgical History:  Procedure Laterality Date   BREAST SURGERY     br bx-lt   COLONOSCOPY     CYST REMOVAL TRUNK Left 05/09/2013   Procedure: EXCISION OF UPPER BACK SEBACEOUS CYST REMOVAL ;  Surgeon: Fran Imus, MD;  Location: [facility];  Service: General;  Laterality: Left;   TOTAL ABDOMINAL HYSTERECTOMY  01/21/2002     Current Medications: Current Meds  Medication Sig   hydrochlorothiazide  (HYDRODIURIL ) 25 MG tablet Take 25 mg by mouth daily.   irbesartan  (AVAPRO ) 300 MG tablet Take 1 tablet (300 mg total) by mouth every evening. (Patient taking differently: Take 300 mg by mouth every evening. Will start today, was still taking losartan .)   rosuvastatin  (CRESTOR ) 10 MG tablet Take 1 tablet (10 mg total) by mouth 3 (three) times a week.   verapamil (CALAN-SR) 240 MG CR tablet Take 240 mg by mouth daily.     Allergies:   Amlodipine, Amlodipine besylate, and Diltiazem hcl   Social History   Socioeconomic History   Marital status: Divorced    Spouse name: Not on file   Number of children: Not on file   Years of education: Not on file   Highest education level: Not on file  Occupational History   Not on file  Tobacco Use   Smoking status: Never   Smokeless tobacco: Never  Substance and Sexual Activity   Alcohol use: No   Drug use: No   Sexual activity: Not on file  Other Topics Concern   Not on file  Social History Narrative   ** Merged History Encounter **       Social Drivers of Health   Financial Resource Strain: Low Risk  (05/11/2023)   Overall Financial Resource Strain (CARDIA)    Difficulty of Paying Living Expenses: Not hard at all  Food Insecurity: No Food Insecurity (05/11/2023)   Hunger Vital Sign    Worried About Running Out of Food in the Last Year: Never true    Ran Out of Food in the Last Year: Never true  Transportation Needs: No Transportation Needs (05/11/2023)   PRAPARE - Administrator, Civil Service (Medical): No    Lack of Transportation (Non-Medical): No  Physical Activity: Insufficiently Active (05/11/2023)   Exercise Vital Sign    Days of Exercise per Week: 1 day    Minutes of Exercise per Session: 30 min  Stress: Not on file  Social Connections: Not on file     Family History: The patient's family history includes CAD in her maternal grandmother;  COPD in her father; CVA in her maternal grandfather and maternal grandmother; Diabetes in her brother and mother; Hypertension in her brother.  ROS:   Please see the history of present illness.     All other systems reviewed and are negative.  EKGs/Labs/Other Studies Reviewed:    EKG:  EKG is not ordered today.   Recent Labs: 11/22/2023: BUN 15; Creatinine, Ser 1.11; Potassium 4.3; Sodium 138   Recent Lipid Panel No results found for: "CHOL", "TRIG", "HDL", "CHOLHDL", "VLDL", "LDLCALC", "LDLDIRECT"  Physical Exam:   VS:  BP (!) 142/84   Pulse 72   Ht 5' 3.5" (1.613 m)   Wt 255 lb 12.8 oz (116 kg)   SpO2 98%   BMI 44.60 kg/m  , BMI Body mass  index is 44.6 kg/m. GENERAL:  Well appearing HEENT: Pupils equal round and reactive, fundi not visualized, oral mucosa unremarkable NECK:  No jugular venous distention, waveform within normal limits, carotid upstroke brisk and symmetric, no bruits, no thyromegaly LUNGS:  Clear to auscultation bilaterally HEART:  RRR.  PMI not displaced or sustained,S1 and S2 within normal limits, no S3, no S4, no clicks, no rubs, no murmurs ABD:  Flat, positive bowel sounds normal in frequency in pitch, no bruits, no rebound, no guarding, no midline pulsatile mass, no hepatomegaly, no splenomegaly EXT:  2 plus pulses throughout, no edema, no cyanosis no clubbing SKIN:  No rashes no nodules NEURO:  Cranial nerves II through XII grossly intact, motor grossly intact throughout PSYCH:  Cognitively intact, oriented to person place and time   ASSESSMENT/PLAN:    Assessment & Plan # Hypertension Home and office blood pressure readings elevated. Transitioning to irbesartan  for better control. - Switch from losartan  to irbesartan . - Refill hydrochlorothiazide . - Sprionolactone was ordered but given that BP was only mildly elevated and she hasn't been taking hydrochlorothiazide , will remove spironolactone  from her med list for now.  - Monitor and track home blood  pressure readings.  # Aortic dilation Aortic dilation at 4 to 4.3 cm.It has been stable.  Focus on blood pressure control to prevent further dilation. Surgery not indicated until 5-5.5 cm. - Schedule echocardiogram in August 2025. - Emphasize blood pressure control.  # Hyperlipidemia Managed with rosuvastatin , though she has not yet started it. - Continue rosuvastatin . - Recheck cholesterol in three months. - Schedule follow-up in three months with fasting labs a week prior.   Screening for Secondary Hypertension:     05/11/2023    8:55 AM  Causes  Renovascular HTN Screened     - Comments 05/2023 renal duplex ordered  Sleep Apnea Screened     - Comments treated  Thyroid  Disease Screened     - Comments 10/2022 normal TSH  Hyperaldosteronism Screened     - Comments 05/2023 renin-aldosterone ordered  Pheochromocytoma Screened  Cushing's Syndrome Screened  Coarctation of the Aorta Screened     - Comments BP symmetrical  Compliance Screened     - Comments taking routinely    Relevant Labs/Studies:    Latest Ref Rng & Units 11/22/2023   11:32 AM 05/11/2023    9:50 AM 11/17/2022   11:50 AM  Basic Labs  Sodium 134 - 144 mmol/L 138  141  138   Potassium 3.5 - 5.2 mmol/L 4.3  4.3  4.5   Creatinine 0.57 - 1.00 mg/dL 5.40  9.81  1.91           Latest Ref Rng & Units 05/11/2023    9:50 AM  Renin/Aldosterone   Aldosterone 0.0 - 30.0 ng/dL 4.2   Aldos/Renin Ratio 0.0 - 30.0 2.8        Latest Ref Rng & Units 05/11/2023    9:50 AM  Metanephrines/Catecholamines   Epinephrine  0 - 62 pg/mL 24   Norepinephrine 0 - 874 pg/mL 799   Dopamine 0 - 48 pg/mL 34   Metanephrines 0.0 - 88.0 pg/mL Comment   Normetanephrines  0.0 - 285.2 pg/mL 102.8           06/07/2023   10:35 AM  Renovascular   Renal Artery US  Completed Yes     Disposition:    FU with MD/PharmD in 3 months   Medication Adjustments/Labs and Tests Ordered: Current medicines are reviewed at length with the  patient today.   Concerns regarding medicines are outlined above.  Orders Placed This Encounter  Procedures   Lipid panel   Comprehensive metabolic panel with GFR   No orders of the defined types were placed in this encounter.    Signed, Maudine Sos, MD  03/08/2024 12:27 PM    [facility]

## 2024-03-28 ENCOUNTER — Other Ambulatory Visit: Payer: Self-pay | Admitting: Endocrinology

## 2024-03-28 DIAGNOSIS — E042 Nontoxic multinodular goiter: Secondary | ICD-10-CM

## 2024-04-09 ENCOUNTER — Encounter: Payer: Self-pay | Admitting: Endocrinology

## 2024-04-10 ENCOUNTER — Inpatient Hospital Stay
Admission: RE | Admit: 2024-04-10 | Discharge: 2024-04-10 | Discharge status: Home / Self Care | Source: Ambulatory Visit | Attending: Endocrinology | Admitting: Endocrinology

## 2024-04-10 DIAGNOSIS — E042 Nontoxic multinodular goiter: Secondary | ICD-10-CM

## 2024-04-23 ENCOUNTER — Other Ambulatory Visit (HOSPITAL_COMMUNITY): Payer: Self-pay | Admitting: Endocrinology

## 2024-04-23 DIAGNOSIS — E042 Nontoxic multinodular goiter: Secondary | ICD-10-CM

## 2024-05-14 ENCOUNTER — Ambulatory Visit (HOSPITAL_BASED_OUTPATIENT_CLINIC_OR_DEPARTMENT_OTHER)

## 2024-05-14 DIAGNOSIS — I10 Essential (primary) hypertension: Secondary | ICD-10-CM

## 2024-05-14 LAB — ECHOCARDIOGRAM COMPLETE
AV Vena cont: 1.98 cm
Area-P 1/2: 3.21 cm2
S' Lateral: 2.48 cm

## 2024-05-15 ENCOUNTER — Ambulatory Visit (HOSPITAL_BASED_OUTPATIENT_CLINIC_OR_DEPARTMENT_OTHER): Payer: Self-pay | Admitting: Family

## 2024-06-19 ENCOUNTER — Encounter: Payer: Self-pay | Admitting: *Deleted

## 2024-06-20 ENCOUNTER — Ambulatory Visit (HOSPITAL_BASED_OUTPATIENT_CLINIC_OR_DEPARTMENT_OTHER): Admitting: Cardiovascular Disease

## 2024-06-20 ENCOUNTER — Encounter (HOSPITAL_BASED_OUTPATIENT_CLINIC_OR_DEPARTMENT_OTHER): Payer: Self-pay | Admitting: Cardiovascular Disease

## 2024-06-20 VITALS — BP 126/79 | HR 78 | Ht 64.0 in | Wt 257.0 lb

## 2024-06-20 DIAGNOSIS — I701 Atherosclerosis of renal artery: Secondary | ICD-10-CM

## 2024-06-20 DIAGNOSIS — E7800 Pure hypercholesterolemia, unspecified: Secondary | ICD-10-CM

## 2024-06-20 DIAGNOSIS — I10 Essential (primary) hypertension: Secondary | ICD-10-CM | POA: Diagnosis not present

## 2024-06-20 DIAGNOSIS — E785 Hyperlipidemia, unspecified: Secondary | ICD-10-CM

## 2024-06-20 DIAGNOSIS — I7121 Aneurysm of the ascending aorta, without rupture: Secondary | ICD-10-CM

## 2024-06-20 MED ORDER — IRBESARTAN 300 MG PO TABS: 300.0000 mg | ORAL_TABLET | Freq: Every evening | ORAL | 1 refills | Status: AC

## 2024-06-20 MED ORDER — HYDROCHLOROTHIAZIDE 25 MG PO TABS: 25.0000 mg | ORAL_TABLET | Freq: Every day | ORAL | 1 refills | Status: AC

## 2024-06-20 MED ORDER — ROSUVASTATIN CALCIUM 10 MG PO TABS: 10.0000 mg | ORAL_TABLET | ORAL | 3 refills | Status: AC

## 2024-06-20 MED ORDER — VERAPAMIL HCL ER 240 MG PO TBCR: 240.0000 mg | EXTENDED_RELEASE_TABLET | Freq: Every day | ORAL | 1 refills | Status: AC

## 2024-06-20 NOTE — Patient Instructions (Signed)
 Medication Instructions:  Your physician recommends that you continue on your current medications as directed. Please refer to the Current Medication list given to you today.   Labwork: NONE  Testing/Procedures: NONE  Follow-Up: 6 MONTHS WITH DR [place] OR CAITLIN W NP   If you need a refill on your cardiac medications before your next appointment, please call your pharmacy.

## 2024-06-20 NOTE — Progress Notes (Signed)
 [facility] Initial Assessment:    Date:  06/20/2024   ID:  Veronica Schmidt, DOB January 06, 1956, MRN [id number]  PCP:  Veronica Opal, DO  Cardiologist:  Veronica Scarce, MD  Nephrologist:  Referring MD: Veronica Opal, DO   CC: Hypertension  History of Present Illness:    Veronica Schmidt is a 68 y.o. female with a hx of hypertension, OSA on CPAP, obesity, mild renal artery stenosis, mild aortic aneurysm, prediabetes and mild ascending aorta aneurysm here for follow up.  She first established care in the [facility] 05/2023.  Prior to that she saw Veronica Schmidt 11/2022 with chest pain.  Coronary CT-A showed no obstructive disease and a calcium  score of 0.  Her ascending aorta was 4.3 cm.  She saw her PCP 02/2023 and hydrochlorothiazide  was increased.    Veronica Schmidt was diagnosed with hypertension in her 30s.  At her initial visit she was enrolled in the [employer] RPM study nad referred to [facility].  Renal Doppler 05/2023 revealed 1-59% stenosis bilaterally.  Echo was unremarkable other than aorta 4.3 cm. BP was well-controlled at her visit 07/2023.  She was started on rosuvastatin  due to renal artery stenosis.  She was encouraged to use her CPAP.  At her appointment 09/2023 she was concerned about her newly diagnosed aortic aneurysm.  She was exercising at the [facility].  Spironolactone  25 mg daily was added to her regimen.  At her visit 11/2023 she noted that she was not taking rosuvastatin .  She was taking spironolactone .  Blood pressures were labile ranging from 110s to 150s.  She noted that it was better in the evenings and in the mornings.  She was struggling to get her exercising due to caregiving responsibilities.  Blood pressure was well-controlled.  Losartan  was switched to irbesartan .  She was referred to our care guide for stress management.    At her visit 02/2024 there was confusion about her medication regimen.  She was out of hydrochlorothiazide  and home BP was running  higher than usual.  SHe never switched form losartan  to irbesartan , so that change occurred.  She never filled the spironolactone , so it was removed from her regimen She was encouraged to start rosuvastatin  as prescribed.   Discussed the use of AI scribe software for clinical note transcription with the patient, who gave verbal consent to proceed.  History of Present Illness Veronica Schmidt experienced a dull, deep chest pain once while sitting, lasting about a minute, not associated with any strenuous activity. The pain occurred after she had finished running after her granddaughter and has not recurred.  She has a history of hypertension and is currently taking hydrochlorothiazide , losartan , and verapamil . She has not been checking her blood pressure at home recently. For hyperlipidemia, she is on rosuvastatin . She also has sleep apnea but has not been using her CPAP machine after discontinuing it due to a cold.  She wants to lose weight and has been attempting to improve her diet, although she admits to recent lapses. She is involved in meal preparation for her family and is trying to reduce carbohydrate intake, focusing on proteins like chicken and beans, and increasing vegetable consumption. Her granddaughter's preference for carbohydrates has influenced her dietary habits.  She is concerned about her heart health, mentioning a past CT scan and a recent echocardiogram.  No recurrence of chest pain and no exertional symptoms reported.  Previous antihypertensives:    Past Medical History:  Diagnosis Date   Anemia  Ankle pain, right    Chest pain    Constipation    Diverticulitis    Estrogen deficiency    Hypercalcemia    Hypertension    Morbid obesity (HCC) 09/13/2023   Obesity    OSA (obstructive sleep apnea)    Pure hypercholesterolemia 03/08/2024   Sleep disturbance    Snores    Urine frequency    Wears glasses     Past Surgical History:  Procedure Laterality Date   BREAST  SURGERY     br [id number]   COLONOSCOPY     CYST REMOVAL TRUNK Left 05/09/2013   Procedure: EXCISION OF UPPER BACK SEBACEOUS CYST REMOVAL ;  Surgeon: Camellia CHRISTELLA Blush, MD;  Location: [facility];  Service: General;  Laterality: Left;   TOTAL ABDOMINAL HYSTERECTOMY  01/21/2002    Current Medications: Current Meds  Medication Sig   [DISCONTINUED] hydrochlorothiazide  (HYDRODIURIL ) 25 MG tablet Take 25 mg by mouth daily.   [DISCONTINUED] irbesartan  (AVAPRO ) 300 MG tablet Take 1 tablet (300 mg total) by mouth every evening. (Patient taking differently: Take 300 mg by mouth every evening. Will start today, was still taking losartan .)   [DISCONTINUED] rosuvastatin  (CRESTOR ) 10 MG tablet Take 1 tablet (10 mg total) by mouth 3 (three) times a week.   [DISCONTINUED] verapamil  (CALAN -SR) 240 MG CR tablet Take 240 mg by mouth daily.     Allergies:   Amlodipine, Amlodipine besylate, and Diltiazem hcl   Social History   Socioeconomic History   Marital status: Divorced    Spouse name: Not on file   Number of children: Not on file   Years of education: Not on file   Highest education level: Not on file  Occupational History   Not on file  Tobacco Use   Smoking status: Never   Smokeless tobacco: Never  Substance and Sexual Activity   Alcohol use: No   Drug use: No   Sexual activity: Not on file  Other Topics Concern   Not on file  Social History Narrative   ** Merged History Encounter **       Social Drivers of Health   Financial Resource Strain: Low Risk  (05/11/2023)   Overall Financial Resource Strain (CARDIA)    Difficulty of Paying Living Expenses: Not hard at all  Food Insecurity: No Food Insecurity (05/11/2023)   Hunger Vital Sign    Worried About Running Out of Food in the Last Year: Never true    Ran Out of Food in the Last Year: Never true  Transportation Needs: No Transportation Needs (05/11/2023)   PRAPARE - Administrator, Civil Service (Medical): No    Lack  of Transportation (Non-Medical): No  Physical Activity: Inactive (06/20/2024)   Exercise Vital Sign    Days of Exercise per Week: 0 days    Minutes of Exercise per Session: 0 min  Stress: No Stress Concern Present (06/20/2024)   [employer] of Occupational Health - Occupational Stress Questionnaire    Feeling of Stress: Not at all  Social Connections: Moderately Integrated (06/20/2024)   Social Connection and Isolation Panel    Frequency of Communication with Friends and Family: More than three times a week    Frequency of Social Gatherings with Friends and Family: More than three times a week    Attends Religious Services: More than 4 times per year    Active Member of [employer] or Organizations: Yes    Attends Banker Meetings: More than 4 times  per year    Marital Status: Divorced     Family History: The patient's family history includes CAD in her maternal grandmother; COPD in her father; CVA in her maternal grandfather and maternal grandmother; Diabetes in her brother and mother; Hypertension in her brother.  ROS:   Please see the history of present illness.     All other systems reviewed and are negative.  EKGs/Labs/Other Studies Reviewed:    EKG:  EKG is ordered today.   EKG Interpretation Date/Time:  Thursday June 20 2024 10:24:18 EDT Ventricular Rate:  76 PR Interval:  216 QRS Duration:  88 QT Interval:  368 QTC Calculation: 414 R Axis:   10  Text Interpretation: Sinus rhythm with 1st degree A-V block Nonspecific T wave abnormality When compared with ECG of 13-Jul-2023 08:41, PR interval has increased Confirmed by Raford Riggs ([id number]) on 06/20/2024 10:34:15 AM        Recent Labs: 11/22/2023: BUN 15; Creatinine, Ser 1.11; Potassium 4.3; Sodium 138   Recent Lipid Panel No results found for: CHOL, TRIG, HDL, CHOLHDL, VLDL, LDLCALC, LDLDIRECT  Physical Exam:   VS:  BP 126/79 (BP Location: Left Arm)   Pulse 78   Ht 5' 4 (1.626  m)   Wt 257 lb (116.6 kg)   SpO2 98%   BMI 44.11 kg/m  , BMI Body mass index is 44.11 kg/m. GENERAL:  Well appearing HEENT: Pupils equal round and reactive, fundi not visualized, oral mucosa unremarkable NECK:  No jugular venous distention, waveform within normal limits, carotid upstroke brisk and symmetric, no bruits, no thyromegaly LUNGS:  Clear to auscultation bilaterally HEART:  RRR.  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  BP now well-controlled.  Continue to monitor every 1-2 years.   # Primary hypertension Blood pressure controlled with current medication regimen. - Refilled hydrochlorothiazide , irbesartan , and verapamil  for six months.  # Pure hypercholesterolemia Cholesterol managed with rosuvastatin . - Refilled rosuvastatin  for six months.  # Morbid obesity due to excess calories Acknowledged need for weight loss, plans to start meal prepping.  # Obstructive sleep apnea Not using CPAP machine, advised to resume use. - Advised to retrieve CPAP machine from home and resume use.   Screening for Secondary Hypertension:     05/11/2023    8:55 AM  Causes  Renovascular HTN Screened     - Comments 05/2023 renal duplex ordered  Sleep Apnea Screened     - Comments treated  Thyroid  Disease Screened     - Comments 10/2022 normal TSH   Hyperaldosteronism Screened     - Comments 05/2023 renin-aldosterone ordered  Pheochromocytoma Screened  Cushing's Syndrome Screened  Coarctation of the Aorta Screened     - Comments BP symmetrical  Compliance Screened     - Comments taking routinely    Relevant Labs/Studies:    Latest Ref Rng & Units 11/22/2023  11:32 AM 05/11/2023    9:50 AM 11/17/2022   11:50 AM  Basic Labs  Sodium 134 - 144 mmol/L 138  141  138   Potassium 3.5 - 5.2 mmol/L 4.3  4.3  4.5   Creatinine 0.57 - 1.00 mg/dL 8.88  8.90  9.17           Latest Ref Rng & Units 05/11/2023    9:50 AM  Renin/Aldosterone   Aldosterone 0.0 - 30.0 ng/dL 4.2   Aldos/Renin Ratio 0.0 - 30.0 2.8        Latest Ref Rng & Units 05/11/2023    9:50 AM  Metanephrines/Catecholamines   Epinephrine  0 - 62 pg/mL 24   Norepinephrine 0 - 874 pg/mL 799   Dopamine 0 - 48 pg/mL 34   Metanephrines 0.0 - 88.0 pg/mL Comment   Normetanephrines  0.0 - 285.2 pg/mL 102.8           06/07/2023   10:35 AM  Renovascular   Renal Artery US  Completed Yes     Disposition:    FU with MD/PharmD in 6 months   Medication Adjustments/Labs and Tests Ordered: Current medicines are reviewed at length with the patient today.  Concerns regarding medicines are outlined above.  Orders Placed This Encounter  Procedures   EKG 12-Lead   Meds ordered this encounter  Medications   rosuvastatin  (CRESTOR ) 10 MG tablet    Sig: Take 1 tablet (10 mg total) by mouth 3 (three) times a week.    Dispense:  45 tablet    Refill:  3   verapamil  (CALAN -SR) 240 MG CR tablet    Sig: Take 1 tablet (240 mg total) by mouth daily.    Dispense:  90 tablet    Refill:  1   hydrochlorothiazide  (HYDRODIURIL ) 25 MG tablet    Sig: Take 1 tablet (25 mg total) by mouth daily.    Dispense:  90 tablet    Refill:  1   irbesartan  (AVAPRO ) 300 MG tablet    Sig: Take 1 tablet (300 mg total) by mouth every evening.    Dispense:  90 tablet    Refill:  1    D/C LOSARTAN      Signed, Veronica Scarce, MD  06/20/2024 3:18 PM    [facility]

## 2024-07-10 DIAGNOSIS — M1990 Unspecified osteoarthritis, unspecified site: Secondary | ICD-10-CM | POA: Diagnosis not present

## 2024-07-10 DIAGNOSIS — I701 Atherosclerosis of renal artery: Secondary | ICD-10-CM | POA: Diagnosis not present

## 2024-07-10 DIAGNOSIS — E785 Hyperlipidemia, unspecified: Secondary | ICD-10-CM | POA: Diagnosis not present

## 2024-07-10 DIAGNOSIS — G4733 Obstructive sleep apnea (adult) (pediatric): Secondary | ICD-10-CM | POA: Diagnosis not present

## 2024-07-10 DIAGNOSIS — E0500 Thyrotoxicosis with diffuse goiter without thyrotoxic crisis or storm: Secondary | ICD-10-CM | POA: Diagnosis not present

## 2024-07-10 DIAGNOSIS — N182 Chronic kidney disease, stage 2 (mild): Secondary | ICD-10-CM | POA: Diagnosis not present

## 2024-07-10 DIAGNOSIS — I129 Hypertensive chronic kidney disease with stage 1 through stage 4 chronic kidney disease, or unspecified chronic kidney disease: Secondary | ICD-10-CM | POA: Diagnosis not present

## 2024-07-10 DIAGNOSIS — R7303 Prediabetes: Secondary | ICD-10-CM | POA: Diagnosis not present

## 2024-07-11 ENCOUNTER — Telehealth: Payer: Self-pay | Admitting: Cardiovascular Disease

## 2024-07-11 NOTE — Telephone Encounter (Signed)
 Tried to call patient, mailbox full   Mychart message sent to patient

## 2024-07-11 NOTE — Telephone Encounter (Signed)
 Pt c/o BP issue: STAT if pt c/o blurred vision, one-sided weakness or slurred speech.  STAT if BP is GREATER than 180/120 TODAY.  STAT if BP is LESS than 90/60 and SYMPTOMATIC TODAY  1. What is your BP concern? elevated  2. Have you taken any BP medication today?yes  3. What are your last 5 BP readings? 142/92 79, 139/99 81  4. Are you having any other symptoms (ex. Dizziness, headache, blurred vision, passed out)? no

## 2024-07-17 NOTE — Telephone Encounter (Addendum)
 Tried calling, viocemail full

## 2024-07-23 NOTE — Telephone Encounter (Signed)
 Called and spoke with Miss Veronica Schmidt. Notes her BP has been high. No alarming symptoms reported. Can't provide specific readings as her mother is at [facility] awaiting PPM placement.   She will call back later this week or next week regarding BP readings..   Veronica Vig S Sephiroth Mcluckie, NP
